# Patient Record
Sex: Female | Born: 1985
Health system: Southern US, Community
[De-identification: ages and names within clinical notes are randomized; demographics above are authoritative.]

## PROBLEM LIST (undated history)

## (undated) ENCOUNTER — Inpatient Hospital Stay (HOSPITAL_COMMUNITY): Payer: Self-pay

## (undated) DIAGNOSIS — N12 Tubulo-interstitial nephritis, not specified as acute or chronic: Secondary | ICD-10-CM

## (undated) DIAGNOSIS — D649 Anemia, unspecified: Secondary | ICD-10-CM

## (undated) DIAGNOSIS — O24419 Gestational diabetes mellitus in pregnancy, unspecified control: Secondary | ICD-10-CM

## (undated) DIAGNOSIS — N644 Mastodynia: Secondary | ICD-10-CM

## (undated) DIAGNOSIS — T8859XA Other complications of anesthesia, initial encounter: Secondary | ICD-10-CM

## (undated) HISTORY — DX: Anemia, unspecified: D64.9

## (undated) HISTORY — DX: Tubulo-interstitial nephritis, not specified as acute or chronic: N12

---

## 2007-01-06 ENCOUNTER — Inpatient Hospital Stay (HOSPITAL_COMMUNITY): Admission: AD | Admit: 2007-01-06 | Discharge: 2007-01-06 | Payer: Self-pay | Admitting: Obstetrics & Gynecology

## 2007-02-11 ENCOUNTER — Inpatient Hospital Stay (HOSPITAL_COMMUNITY): Admission: AD | Admit: 2007-02-11 | Discharge: 2007-02-11 | Payer: Self-pay | Admitting: Obstetrics and Gynecology

## 2007-06-26 ENCOUNTER — Encounter: Admission: RE | Admit: 2007-06-26 | Discharge: 2007-06-26 | Payer: Self-pay | Admitting: *Deleted

## 2007-08-28 ENCOUNTER — Inpatient Hospital Stay (HOSPITAL_COMMUNITY): Admission: AD | Admit: 2007-08-28 | Discharge: 2007-09-01 | Payer: Self-pay | Admitting: Obstetrics & Gynecology

## 2007-08-28 ENCOUNTER — Ambulatory Visit: Payer: Self-pay | Admitting: Gynecology

## 2008-05-04 ENCOUNTER — Emergency Department (HOSPITAL_COMMUNITY): Admission: EM | Admit: 2008-05-04 | Discharge: 2008-05-04 | Payer: Self-pay | Admitting: *Deleted

## 2010-09-12 ENCOUNTER — Inpatient Hospital Stay (HOSPITAL_COMMUNITY)
Admission: AD | Admit: 2010-09-12 | Discharge: 2010-09-12 | Payer: Self-pay | Source: Home / Self Care | Admitting: Obstetrics and Gynecology

## 2010-11-12 ENCOUNTER — Inpatient Hospital Stay (HOSPITAL_COMMUNITY)
Admission: AD | Admit: 2010-11-12 | Discharge: 2010-11-12 | Disposition: A | Discharge status: Home / Self Care | Payer: Self-pay | Source: Ambulatory Visit | Attending: Obstetrics & Gynecology | Admitting: Obstetrics & Gynecology

## 2010-11-12 DIAGNOSIS — O9989 Other specified diseases and conditions complicating pregnancy, childbirth and the puerperium: Secondary | ICD-10-CM

## 2010-11-12 DIAGNOSIS — O99891 Other specified diseases and conditions complicating pregnancy: Secondary | ICD-10-CM | POA: Insufficient documentation

## 2010-11-12 DIAGNOSIS — Y92410 Unspecified street and highway as the place of occurrence of the external cause: Secondary | ICD-10-CM | POA: Insufficient documentation

## 2010-11-12 LAB — URINALYSIS, ROUTINE W REFLEX MICROSCOPIC
Bilirubin Urine: NEGATIVE
Nitrite: NEGATIVE
Specific Gravity, Urine: <1.005 — ABNORMAL LOW (ref 1.005–1.030)
Urine Glucose, Fasting: NEGATIVE mg/dL
pH: 6 (ref 5.0–8.0)

## 2010-11-17 ENCOUNTER — Inpatient Hospital Stay (HOSPITAL_COMMUNITY)
Admission: EM | Admit: 2010-11-17 | Discharge: 2010-11-18 | Disposition: A | Discharge status: Home / Self Care | Payer: Self-pay | Source: Ambulatory Visit | Attending: Obstetrics & Gynecology | Admitting: Obstetrics & Gynecology

## 2010-11-17 DIAGNOSIS — R109 Unspecified abdominal pain: Secondary | ICD-10-CM

## 2010-11-17 DIAGNOSIS — O99891 Other specified diseases and conditions complicating pregnancy: Secondary | ICD-10-CM | POA: Insufficient documentation

## 2010-11-17 DIAGNOSIS — M549 Dorsalgia, unspecified: Secondary | ICD-10-CM

## 2010-11-17 DIAGNOSIS — O9989 Other specified diseases and conditions complicating pregnancy, childbirth and the puerperium: Secondary | ICD-10-CM

## 2010-11-17 LAB — URINALYSIS, ROUTINE W REFLEX MICROSCOPIC
Hgb urine dipstick: NEGATIVE
Nitrite: NEGATIVE
Specific Gravity, Urine: <1.005 — ABNORMAL LOW (ref 1.005–1.030)
Urobilinogen, UA: 0.2 mg/dL (ref 0.0–1.0)
pH: 7 (ref 5.0–8.0)

## 2010-11-20 ENCOUNTER — Other Ambulatory Visit: Payer: Self-pay | Admitting: Family Medicine

## 2010-11-20 DIAGNOSIS — Z3689 Encounter for other specified antenatal screening: Secondary | ICD-10-CM

## 2010-11-21 ENCOUNTER — Ambulatory Visit (HOSPITAL_COMMUNITY)
Admission: RE | Admit: 2010-11-21 | Discharge: 2010-11-21 | Disposition: A | Discharge status: Home / Self Care | Payer: Medicaid Other | Source: Ambulatory Visit | Attending: Family Medicine | Admitting: Family Medicine

## 2010-11-21 ENCOUNTER — Encounter (HOSPITAL_COMMUNITY): Payer: Self-pay

## 2010-11-21 DIAGNOSIS — O358XX0 Maternal care for other (suspected) fetal abnormality and damage, not applicable or unspecified: Secondary | ICD-10-CM | POA: Insufficient documentation

## 2010-11-21 DIAGNOSIS — Z3689 Encounter for other specified antenatal screening: Secondary | ICD-10-CM

## 2010-11-21 DIAGNOSIS — Z1389 Encounter for screening for other disorder: Secondary | ICD-10-CM | POA: Insufficient documentation

## 2010-11-21 DIAGNOSIS — Z363 Encounter for antenatal screening for malformations: Secondary | ICD-10-CM | POA: Insufficient documentation

## 2010-12-04 ENCOUNTER — Other Ambulatory Visit: Payer: Self-pay | Admitting: Obstetrics and Gynecology

## 2010-12-04 DIAGNOSIS — Z09 Encounter for follow-up examination after completed treatment for conditions other than malignant neoplasm: Secondary | ICD-10-CM

## 2010-12-05 ENCOUNTER — Other Ambulatory Visit: Payer: Self-pay | Admitting: Obstetrics and Gynecology

## 2010-12-05 ENCOUNTER — Ambulatory Visit (HOSPITAL_COMMUNITY)
Admission: RE | Admit: 2010-12-05 | Discharge: 2010-12-05 | Disposition: A | Discharge status: Home / Self Care | Payer: Medicaid Other | Source: Ambulatory Visit | Attending: Obstetrics and Gynecology | Admitting: Obstetrics and Gynecology

## 2010-12-05 DIAGNOSIS — Z09 Encounter for follow-up examination after completed treatment for conditions other than malignant neoplasm: Secondary | ICD-10-CM

## 2010-12-05 DIAGNOSIS — O209 Hemorrhage in early pregnancy, unspecified: Secondary | ICD-10-CM | POA: Insufficient documentation

## 2010-12-05 DIAGNOSIS — Z3689 Encounter for other specified antenatal screening: Secondary | ICD-10-CM | POA: Insufficient documentation

## 2010-12-06 ENCOUNTER — Inpatient Hospital Stay (HOSPITAL_COMMUNITY): Admission: AD | Admit: 2010-12-06 | Payer: Self-pay | Source: Home / Self Care | Admitting: Obstetrics & Gynecology

## 2010-12-18 LAB — WET PREP, GENITAL

## 2010-12-18 LAB — URINALYSIS, ROUTINE W REFLEX MICROSCOPIC
Glucose, UA: NEGATIVE mg/dL
Protein, ur: NEGATIVE mg/dL
Specific Gravity, Urine: 1.01 (ref 1.005–1.030)
Urobilinogen, UA: 0.2 mg/dL (ref 0.0–1.0)

## 2010-12-18 LAB — URINE MICROSCOPIC-ADD ON

## 2011-01-08 ENCOUNTER — Other Ambulatory Visit: Payer: Self-pay | Admitting: Family Medicine

## 2011-01-10 ENCOUNTER — Ambulatory Visit (HOSPITAL_COMMUNITY)
Admission: RE | Admit: 2011-01-10 | Discharge: 2011-01-10 | Disposition: A | Discharge status: Home / Self Care | Payer: Self-pay | Source: Ambulatory Visit | Attending: Family Medicine | Admitting: Family Medicine

## 2011-01-10 DIAGNOSIS — O337XX0 Maternal care for disproportion due to other fetal deformities, not applicable or unspecified: Secondary | ICD-10-CM | POA: Insufficient documentation

## 2011-01-10 DIAGNOSIS — O352XX0 Maternal care for (suspected) hereditary disease in fetus, not applicable or unspecified: Secondary | ICD-10-CM | POA: Insufficient documentation

## 2011-01-10 DIAGNOSIS — O3660X0 Maternal care for excessive fetal growth, unspecified trimester, not applicable or unspecified: Secondary | ICD-10-CM | POA: Insufficient documentation

## 2011-01-10 DIAGNOSIS — O09299 Supervision of pregnancy with other poor reproductive or obstetric history, unspecified trimester: Secondary | ICD-10-CM | POA: Insufficient documentation

## 2011-01-28 ENCOUNTER — Other Ambulatory Visit: Payer: Self-pay | Admitting: Obstetrics & Gynecology

## 2011-01-28 ENCOUNTER — Encounter: Payer: Medicaid Other | Attending: Obstetrics & Gynecology | Admitting: Dietician

## 2011-01-28 DIAGNOSIS — Z331 Pregnant state, incidental: Secondary | ICD-10-CM

## 2011-01-28 DIAGNOSIS — O99810 Abnormal glucose complicating pregnancy: Secondary | ICD-10-CM

## 2011-01-28 DIAGNOSIS — O99019 Anemia complicating pregnancy, unspecified trimester: Secondary | ICD-10-CM

## 2011-01-28 DIAGNOSIS — O34219 Maternal care for unspecified type scar from previous cesarean delivery: Secondary | ICD-10-CM

## 2011-01-28 DIAGNOSIS — Z713 Dietary counseling and surveillance: Secondary | ICD-10-CM | POA: Insufficient documentation

## 2011-01-28 LAB — POCT URINALYSIS DIP (DEVICE)
Bilirubin Urine: NEGATIVE
Glucose, UA: NEGATIVE mg/dL
Nitrite: NEGATIVE

## 2011-02-04 ENCOUNTER — Encounter: Payer: Medicaid Other | Attending: Obstetrics & Gynecology | Admitting: Dietician

## 2011-02-04 ENCOUNTER — Other Ambulatory Visit: Payer: Self-pay | Admitting: Obstetrics & Gynecology

## 2011-02-04 DIAGNOSIS — Z331 Pregnant state, incidental: Secondary | ICD-10-CM

## 2011-02-04 DIAGNOSIS — O169 Unspecified maternal hypertension, unspecified trimester: Secondary | ICD-10-CM

## 2011-02-04 DIAGNOSIS — O99810 Abnormal glucose complicating pregnancy: Secondary | ICD-10-CM | POA: Insufficient documentation

## 2011-02-04 DIAGNOSIS — Z713 Dietary counseling and surveillance: Secondary | ICD-10-CM | POA: Insufficient documentation

## 2011-02-04 LAB — POCT URINALYSIS DIP (DEVICE)
Bilirubin Urine: NEGATIVE
Glucose, UA: NEGATIVE mg/dL
Ketones, ur: NEGATIVE mg/dL

## 2011-02-11 ENCOUNTER — Other Ambulatory Visit: Payer: Self-pay | Admitting: Obstetrics & Gynecology

## 2011-02-11 DIAGNOSIS — Z331 Pregnant state, incidental: Secondary | ICD-10-CM

## 2011-02-11 DIAGNOSIS — O24919 Unspecified diabetes mellitus in pregnancy, unspecified trimester: Secondary | ICD-10-CM

## 2011-02-11 LAB — POCT URINALYSIS DIP (DEVICE)
Glucose, UA: NEGATIVE mg/dL
Ketones, ur: NEGATIVE mg/dL
Specific Gravity, Urine: 1.02 (ref 1.005–1.030)
Urobilinogen, UA: 0.2 mg/dL (ref 0.0–1.0)

## 2011-02-18 ENCOUNTER — Other Ambulatory Visit: Payer: Self-pay | Admitting: Obstetrics & Gynecology

## 2011-02-18 DIAGNOSIS — O99019 Anemia complicating pregnancy, unspecified trimester: Secondary | ICD-10-CM

## 2011-02-18 DIAGNOSIS — O34219 Maternal care for unspecified type scar from previous cesarean delivery: Secondary | ICD-10-CM

## 2011-02-18 DIAGNOSIS — Z331 Pregnant state, incidental: Secondary | ICD-10-CM

## 2011-02-18 DIAGNOSIS — O99810 Abnormal glucose complicating pregnancy: Secondary | ICD-10-CM

## 2011-02-18 LAB — POCT URINALYSIS DIP (DEVICE)
Bilirubin Urine: NEGATIVE
Glucose, UA: NEGATIVE mg/dL
Hgb urine dipstick: NEGATIVE
Nitrite: NEGATIVE
Specific Gravity, Urine: 1.025 (ref 1.005–1.030)

## 2011-02-19 NOTE — Discharge Summary (Signed)
Victoria Richmond, Victoria Richmond NO.:  [id number]   MEDICAL RECORD NO.:  [id number]          PATIENT TYPE:  INP   LOCATION:  9110                          FACILITY:  [facility]   PHYSICIAN:  Ginger Carne, MD  DATE OF BIRTH:  August 05, 1986   DATE OF ADMISSION:  08/28/2007  DATE OF DISCHARGE:  09/01/2007                               DISCHARGE SUMMARY   ADMISSION DIAGNOSIS:  Induction of labor, class A2 gestational diabetes.   DISCHARGE DIAGNOSIS:  1. Postoperative day 3 status post primary low transverse cesarean      section for failure to progress in the first stage of labor.  2. Class A2 gestational diabetes status post delivery.   RESULTS:  On admission patient had a CBC performed with a hemoglobin of  11.9.  On postoperative day #1 CBC was repeated and hemoglobin was found  to be 9.5.  White blood cell count was 15.2 and platelet count was 168.   HOSPITAL COURSE:  Patient was admitted for induction of labor secondary  to class A2 gestational diabetes.  The patient was induced, however,  failed to progress in the first stage of labor.  Therefore, the decision  was made to take the patient for a primary low transverse cesarean  section secondary to failure to progress.  This procedure was performed  on August 29, 2007 by Dr. Blima Rich.  This procedure rendered a  viable female infant.  Estimated blood loss was found to be 800 mL.  Infant was alert in vertex presentation.  Weight was noted to be 10  pounds 1 ounce at delivery.  Apgar's are per delivery room record.  Patient had an uncomplicated procedure and was sent to the recovery room  in stable condition.  It should be noted that after the procedure the  patient reported that she could feel her cesarean section during the  time of the procedure.  Thereafter, the patient had difficulty  controlling her pain in her postoperative course.  Her vital signs  remained stable throughout her postoperative course, but she did  have  difficulty with pain control.  Patient was started on Dilaudid PCA.  This was supplemented with Toradol.  She was then transitioned to by  mouth Percocet and Motrin as well.  These medicines did improve her  pain, however, it was not fully controlled.  Throughout her  postoperative course the patient's vital signs did remain stable.  She  remained afebrile as well.  She did have a little bit of tachycardia up  to the low 100s, but blood pressures remained stable.  Postoperative CBG  on the date of discharge was noted to be 142.  On postoperative day #3  vital signs continued to be stable.  Patient had continued to report  pain with some improvement with p.o. medications.  It was discussed with  the patient that given that her vital signs were stable and her blood  counts were stable as well she was fit for discharge.  She was informed  that she would be discharged with Percocet and Motrin to go home with  and patient expressed understanding and agreement with this course of  action.  Patient was advised to return for any increase in her pain,  fever, chills, increased bleeding, dizziness, or any other concerning  symptoms.  Patient expressed agreement and understanding of these  instructions.  On postoperative day #3 patient was discharged home with  her female infant.  She was breast and bottle feeding at the time of  discharge and was in stable and good condition at the time of discharge.   DISCHARGE MEDICATIONS:  1. Percocet 5/325 mg 1-2 tabs q.4 h. p.r.n. pain.  2. Ibuprofen 600 mg p.o. q.6 h. p.r.n. pain.  3. Prenatal vitamins once daily for 6 weeks or while breastfeeding.  4. Micronor once daily for birth control.  5. Colace 100 mg p.o. twice daily as needed for constipation.   DISCHARGE INSTRUCTIONS:  1. The patient is to take medications as mentioned previously.  2. Patient is to return to clinic or seek medical attention for any      increased pain, bleeding, fever,  chills, dizziness, lower extremity      swelling, edema, pain, redness, or any other concerning symptoms.      Patient expresses agreement and understanding with these      instructions.  3. Patient will have a Baby Love Nurse to come to her house to remove      staples on postoperative day 5 through 7.  4. Patient is to followup at the [facility] in      6 weeks routinely for postpartum followup.  5. Patient is to take Micronor once daily for birth control.  6. Patient is to maintain pelvic rest for the next 6 weeks with      nothing in the vagina over this time.  7. Patient is to not undertake any heavy lifting for the next 6 weeks.  8. Patient will have a 2-hour glucose tolerance test at her 6-week      followup visit given her history of gestational diabetes.   DISCHARGE CONDITION:  Stable, good.   DISPOSITION:  Patient is discharged to home.  Baby Love Nurse will come  out to the house to take staples out postoperative day 5-7.  Patient is  breastfeeding and bottle feeding at the time of discharge, and is  discharged in stable and good condition.      Myrtie Soman, MD      Ginger Carne, MD  Electronically Signed    TE/MEDQ  D:  09/01/2007  T:  09/01/2007  Job:  [id number]

## 2011-02-19 NOTE — Op Note (Signed)
Victoria Richmond, Victoria Richmond NO.:  [id number]   MEDICAL RECORD NO.:  [id number]          PATIENT TYPE:  INP   LOCATION:  9175                          [facility]ILITY:  [facility]   PHYSICIAN:  Ginger Carne, MD  DATE OF BIRTH:  [birth year]   DATE OF PROCEDURE:  08/29/2007  DATE OF DISCHARGE:                               OPERATIVE REPORT   PREOPERATIVE DIAGNOSIS:  Failure to progress first stage of labor, A2  gestational diabetes.   POSTOPERATIVE DIAGNOSIS:  1. Failure to progress first stage of labor, A2 gestational diabetes.  2. Viable delivery of female infant.   PROCEDURE:  Primary low transverse cesarean section.   SURGEON:  Ginger Carne, M.D.   ASSISTANT:  None.   COMPLICATIONS:  None immediate.   ESTIMATED BLOOD LOSS:  800 mL secondary to uterine atony.   ANESTHESIA:  Epidural top off.   SPECIMEN:  Cord blood   OPERATIVE FINDINGS:  A term infant female delivered in vertex presentation  weighing 10 pounds 1 ounce, Apgars per delivery room record.  No gross  abnormalities.  Baby cried spontaneously at delivery, vertex  presentation.  Clear fluid.  Uterus, tubes and ovaries showed normal  decidual changes of pregnancy.  Placenta, three-vessel cord central  insertion and placenta complete.  The uterus demonstrated atony  secondary to prolonged first stage of labor and large fetus.  Two doses,  20 minutes apart, of intramuscular Methergine 0.2 mg, 40 units of  Pitocin per liter and 1000 mcg of Cytotec per rectum were needed and  administered.   OPERATIVE PROCEDURE:  The patient was prepped and draped in usual  fashion and placed in the left lateral supine position.  Betadine  solution used for antiseptic and the patient was catheterized prior to  procedure.  After adequate epidural top-off, a Pfannenstiel incision was  made and the abdomen opened.  The lower uterine segment was incised  transversely after developing the bladder flap.  Baby delivered, cord  clamped, cut and infant given to the pediatric staff after bulb  suctioning.  Placenta removed manually.  Uterus inspected.  Closure of  the uterine musculature in one layer of 0 Vicryl running interlocking  suture from either end to midline.  Bleeding points  hemostatically checked.  Blood clots removed.  Closure of the fascia in  one layer of 0 Vicryl running suture.  Skin staples for the skin.  Instrument and sponge counts were correct.  The patient tolerated the  procedure well, returned to the post anesthesia recovery room in  excellent condition.      Ginger Carne, MD  Electronically Signed     SHB/MEDQ  D:  08/29/2007  T:  08/31/2007  Job:  [id number]

## 2011-02-21 ENCOUNTER — Other Ambulatory Visit: Payer: Self-pay

## 2011-02-21 DIAGNOSIS — O99810 Abnormal glucose complicating pregnancy: Secondary | ICD-10-CM

## 2011-02-25 ENCOUNTER — Other Ambulatory Visit: Payer: Self-pay | Admitting: Family Medicine

## 2011-02-25 ENCOUNTER — Other Ambulatory Visit: Payer: Self-pay

## 2011-02-25 DIAGNOSIS — O34219 Maternal care for unspecified type scar from previous cesarean delivery: Secondary | ICD-10-CM

## 2011-02-25 DIAGNOSIS — Z331 Pregnant state, incidental: Secondary | ICD-10-CM

## 2011-02-25 DIAGNOSIS — O99810 Abnormal glucose complicating pregnancy: Secondary | ICD-10-CM

## 2011-02-25 LAB — POCT URINALYSIS DIP (DEVICE)
Bilirubin Urine: NEGATIVE
Ketones, ur: NEGATIVE mg/dL
pH: 6 (ref 5.0–8.0)

## 2011-02-28 ENCOUNTER — Other Ambulatory Visit: Payer: Self-pay

## 2011-02-28 DIAGNOSIS — O99810 Abnormal glucose complicating pregnancy: Secondary | ICD-10-CM

## 2011-02-28 DIAGNOSIS — Z331 Pregnant state, incidental: Secondary | ICD-10-CM

## 2011-03-05 ENCOUNTER — Other Ambulatory Visit: Payer: Self-pay

## 2011-03-05 DIAGNOSIS — O99810 Abnormal glucose complicating pregnancy: Secondary | ICD-10-CM

## 2011-03-07 ENCOUNTER — Other Ambulatory Visit: Payer: Self-pay

## 2011-03-07 ENCOUNTER — Other Ambulatory Visit: Payer: Self-pay | Admitting: Obstetrics and Gynecology

## 2011-03-07 ENCOUNTER — Other Ambulatory Visit: Payer: Self-pay | Admitting: Obstetrics & Gynecology

## 2011-03-07 DIAGNOSIS — Z331 Pregnant state, incidental: Secondary | ICD-10-CM

## 2011-03-07 DIAGNOSIS — O99810 Abnormal glucose complicating pregnancy: Secondary | ICD-10-CM

## 2011-03-07 DIAGNOSIS — Z1389 Encounter for screening for other disorder: Secondary | ICD-10-CM

## 2011-03-07 LAB — POCT URINALYSIS DIP (DEVICE)
Bilirubin Urine: NEGATIVE
Glucose, UA: NEGATIVE mg/dL
Ketones, ur: NEGATIVE mg/dL
Protein, ur: NEGATIVE mg/dL
Specific Gravity, Urine: <=1.005 (ref 1.005–1.030)

## 2011-03-11 ENCOUNTER — Other Ambulatory Visit: Payer: Self-pay | Admitting: Obstetrics & Gynecology

## 2011-03-11 ENCOUNTER — Other Ambulatory Visit: Payer: Self-pay

## 2011-03-11 DIAGNOSIS — O99810 Abnormal glucose complicating pregnancy: Secondary | ICD-10-CM

## 2011-03-11 DIAGNOSIS — Z331 Pregnant state, incidental: Secondary | ICD-10-CM

## 2011-03-11 LAB — POCT URINALYSIS DIP (DEVICE)
Bilirubin Urine: NEGATIVE
Glucose, UA: NEGATIVE mg/dL
Hgb urine dipstick: NEGATIVE
Ketones, ur: NEGATIVE mg/dL
Specific Gravity, Urine: 1.025 (ref 1.005–1.030)

## 2011-03-14 ENCOUNTER — Ambulatory Visit (HOSPITAL_COMMUNITY): Payer: Self-pay

## 2011-03-15 ENCOUNTER — Other Ambulatory Visit: Payer: Self-pay

## 2011-03-15 ENCOUNTER — Ambulatory Visit (HOSPITAL_COMMUNITY): Payer: Self-pay

## 2011-03-15 DIAGNOSIS — O99810 Abnormal glucose complicating pregnancy: Secondary | ICD-10-CM

## 2011-03-18 ENCOUNTER — Other Ambulatory Visit: Payer: Self-pay

## 2011-03-18 ENCOUNTER — Other Ambulatory Visit: Payer: Self-pay | Admitting: Obstetrics & Gynecology

## 2011-03-18 ENCOUNTER — Encounter: Payer: Self-pay | Attending: Obstetrics & Gynecology | Admitting: Dietician

## 2011-03-18 DIAGNOSIS — O99810 Abnormal glucose complicating pregnancy: Secondary | ICD-10-CM

## 2011-03-18 DIAGNOSIS — Z713 Dietary counseling and surveillance: Secondary | ICD-10-CM | POA: Insufficient documentation

## 2011-03-18 DIAGNOSIS — O34219 Maternal care for unspecified type scar from previous cesarean delivery: Secondary | ICD-10-CM

## 2011-03-18 LAB — POCT URINALYSIS DIP (DEVICE)
Bilirubin Urine: NEGATIVE
Hgb urine dipstick: NEGATIVE
Ketones, ur: NEGATIVE mg/dL
Specific Gravity, Urine: 1.025 (ref 1.005–1.030)
pH: 5.5 (ref 5.0–8.0)

## 2011-03-21 ENCOUNTER — Other Ambulatory Visit: Payer: Self-pay

## 2011-03-21 DIAGNOSIS — O99810 Abnormal glucose complicating pregnancy: Secondary | ICD-10-CM

## 2011-03-25 ENCOUNTER — Other Ambulatory Visit: Payer: Self-pay

## 2011-03-25 ENCOUNTER — Other Ambulatory Visit: Payer: Self-pay | Admitting: Obstetrics & Gynecology

## 2011-03-25 DIAGNOSIS — O99810 Abnormal glucose complicating pregnancy: Secondary | ICD-10-CM

## 2011-03-25 DIAGNOSIS — O99019 Anemia complicating pregnancy, unspecified trimester: Secondary | ICD-10-CM

## 2011-03-25 DIAGNOSIS — O34219 Maternal care for unspecified type scar from previous cesarean delivery: Secondary | ICD-10-CM

## 2011-03-25 LAB — POCT URINALYSIS DIP (DEVICE)
Bilirubin Urine: NEGATIVE
Ketones, ur: NEGATIVE mg/dL
Protein, ur: NEGATIVE mg/dL
Specific Gravity, Urine: 1.01 (ref 1.005–1.030)
pH: 5.5 (ref 5.0–8.0)

## 2011-03-28 ENCOUNTER — Other Ambulatory Visit: Payer: Self-pay

## 2011-03-28 DIAGNOSIS — O99810 Abnormal glucose complicating pregnancy: Secondary | ICD-10-CM

## 2011-04-01 ENCOUNTER — Other Ambulatory Visit: Payer: Self-pay

## 2011-04-01 ENCOUNTER — Other Ambulatory Visit: Payer: Self-pay | Admitting: Family Medicine

## 2011-04-01 DIAGNOSIS — O99810 Abnormal glucose complicating pregnancy: Secondary | ICD-10-CM

## 2011-04-01 LAB — POCT URINALYSIS DIP (DEVICE)
Hgb urine dipstick: NEGATIVE
Ketones, ur: NEGATIVE mg/dL
Protein, ur: NEGATIVE mg/dL
Specific Gravity, Urine: 1.025 (ref 1.005–1.030)
Urobilinogen, UA: 0.2 mg/dL (ref 0.0–1.0)

## 2011-04-02 ENCOUNTER — Ambulatory Visit (HOSPITAL_COMMUNITY)
Admission: RE | Admit: 2011-04-02 | Discharge: 2011-04-02 | Disposition: A | Discharge status: Home / Self Care | Payer: Self-pay | Source: Ambulatory Visit | Attending: Obstetrics & Gynecology | Admitting: Obstetrics & Gynecology

## 2011-04-02 ENCOUNTER — Encounter (HOSPITAL_COMMUNITY)
Admission: RE | Admit: 2011-04-02 | Discharge: 2011-04-02 | Disposition: A | Discharge status: Home / Self Care | Payer: Self-pay | Source: Ambulatory Visit | Attending: Obstetrics & Gynecology | Admitting: Obstetrics & Gynecology

## 2011-04-02 DIAGNOSIS — O09299 Supervision of pregnancy with other poor reproductive or obstetric history, unspecified trimester: Secondary | ICD-10-CM | POA: Insufficient documentation

## 2011-04-02 DIAGNOSIS — Z1389 Encounter for screening for other disorder: Secondary | ICD-10-CM

## 2011-04-02 DIAGNOSIS — O3660X0 Maternal care for excessive fetal growth, unspecified trimester, not applicable or unspecified: Secondary | ICD-10-CM | POA: Insufficient documentation

## 2011-04-02 DIAGNOSIS — O99810 Abnormal glucose complicating pregnancy: Secondary | ICD-10-CM | POA: Insufficient documentation

## 2011-04-02 LAB — BASIC METABOLIC PANEL
CO2: 22 mEq/L (ref 19–32)
Calcium: 9.1 mg/dL (ref 8.4–10.5)
Chloride: 104 mEq/L (ref 96–112)
Creatinine, Ser: 0.55 mg/dL (ref 0.50–1.10)
Glucose, Bld: 57 mg/dL — ABNORMAL LOW (ref 70–99)
Sodium: 136 mEq/L (ref 135–145)

## 2011-04-02 LAB — SURGICAL PCR SCREEN
MRSA, PCR: NEGATIVE
Staphylococcus aureus: NEGATIVE

## 2011-04-02 LAB — CBC
HCT: 33.4 % — ABNORMAL LOW (ref 36.0–46.0)
MCH: 28.9 pg (ref 26.0–34.0)
MCV: 87.7 fL (ref 78.0–100.0)
Platelets: 197 10*3/uL (ref 150–400)
RBC: 3.81 MIL/uL — ABNORMAL LOW (ref 3.87–5.11)
RDW: 14.6 % (ref 11.5–15.5)

## 2011-04-04 ENCOUNTER — Other Ambulatory Visit: Payer: Self-pay

## 2011-04-04 DIAGNOSIS — O99810 Abnormal glucose complicating pregnancy: Secondary | ICD-10-CM

## 2011-04-06 ENCOUNTER — Inpatient Hospital Stay (HOSPITAL_COMMUNITY)
Admission: RE | Admit: 2011-04-06 | Discharge: 2011-04-06 | Disposition: A | Discharge status: Home / Self Care | Payer: Self-pay | Source: Ambulatory Visit | Attending: Obstetrics and Gynecology | Admitting: Obstetrics and Gynecology

## 2011-04-06 DIAGNOSIS — O479 False labor, unspecified: Secondary | ICD-10-CM | POA: Insufficient documentation

## 2011-04-06 LAB — GLUCOSE, CAPILLARY: Glucose-Capillary: 68 mg/dL — ABNORMAL LOW (ref 70–99)

## 2011-04-08 ENCOUNTER — Inpatient Hospital Stay (HOSPITAL_COMMUNITY)
Admission: RE | Admit: 2011-04-08 | Discharge: 2011-04-10 | DRG: 766 | Disposition: A | Discharge status: Home / Self Care | Payer: Medicaid Other | Source: Ambulatory Visit | Attending: Obstetrics & Gynecology | Admitting: Obstetrics & Gynecology

## 2011-04-08 DIAGNOSIS — O9989 Other specified diseases and conditions complicating pregnancy, childbirth and the puerperium: Secondary | ICD-10-CM

## 2011-04-08 DIAGNOSIS — O99814 Abnormal glucose complicating childbirth: Secondary | ICD-10-CM

## 2011-04-08 DIAGNOSIS — M25519 Pain in unspecified shoulder: Secondary | ICD-10-CM | POA: Diagnosis not present

## 2011-04-08 DIAGNOSIS — O99893 Other specified diseases and conditions complicating puerperium: Secondary | ICD-10-CM | POA: Diagnosis not present

## 2011-04-08 DIAGNOSIS — O34219 Maternal care for unspecified type scar from previous cesarean delivery: Secondary | ICD-10-CM

## 2011-04-09 LAB — CBC
MCH: 28.3 pg (ref 26.0–34.0)
MCHC: 32.3 g/dL (ref 30.0–36.0)
MCV: 87.6 fL (ref 78.0–100.0)
Platelets: 172 10*3/uL (ref 150–400)
RBC: 3.64 MIL/uL — ABNORMAL LOW (ref 3.87–5.11)
RDW: 15 % (ref 11.5–15.5)

## 2011-04-09 LAB — GLUCOSE, CAPILLARY: Glucose-Capillary: 121 mg/dL — ABNORMAL HIGH (ref 70–99)

## 2011-04-12 LAB — TYPE AND SCREEN
ABO/RH(D): O POS
DAT, IgG: NEGATIVE
Unit division: 0

## 2011-04-16 NOTE — Op Note (Addendum)
Victoria Richmond, Victoria Richmond NO.:  [id number]  MEDICAL RECORD NO.:  [id number]  LOCATION:  9143                          [facility]:  [facility]  PHYSICIAN:  Scheryl Darter, MD       DATE OF BIRTH:  Mar 02, 1986  DATE OF PROCEDURE:  04/08/2011 DATE OF DISCHARGE:                              OPERATIVE REPORT   PREOPERATIVE DIAGNOSIS:  The patient is a 25 year old gravida 2, para 1- 0-0-1 at 29 and 2 weeks' estimated gestational age with a history of cesarean section who desires a repeat.  POSTOPERATIVE DIAGNOSIS:  The patient is a 25 year old gravida 2, para 1- 0-0-1 at 8 and 2 weeks' estimated gestational age with a history of cesarean section who desires a repeat.  PROCEDURE:  Repeat low transverse cesarean section.  SURGEONS: 1. Scheryl Darter, MD 2. Lucina Mellow, DO  ANESTHESIA:  Spinal.  FINDINGS:  Viable infant female in vertex position.  Weight 8 pounds 9 ounces.  Apgars 9 at 1 minute and 9 at 5 minutes.  Normal tubes, uterus, and ovaries.  Many adhesions were noted.  SPECIMEN:  Placenta to labor and delivery.  ESTIMATED BLOOD LOSS:  700.  FLUIDS:  2500.  URINE:  800.  PROCEDURE IN DETAIL:  The patient was taken to the operating room where the spinal anesthesia was found to be adequate.  She was prepared and draped in the normal sterile fashion in the dorsal supine position with a leftward tilt.  A Pfannensteil skin incision was then made with a scalpel and carried through to the underlying fascia with a knife.  The fascia was incised in the midline and the incision was extended laterally with the Mayo scissors.  The superior aspect of the fascial incision was grasped with the Kocher clamps, elevated, and the underlying rectus muscles were dissected off bluntly.  Attention was then turned to the inferior aspect of this incision which in similar fashion was grasped, tented up with Kocher clamps and the rectus muscle was dissected off bluntly.  Many  adhesions were encountered at this time.  The peritoneum was identified and entered and adhesions were broken down.  It was difficult to determine if the bladder was part adhesions but they were separated and broken down.  The urine remained clear.  The bladder blade was inserted and the serosal layer and the vesicouterine layer of the uterus were identified and adhesions were broken down to identify the appropriate area on the uterus for incision. The uterus was incised in the upper portion of the lower uterine segment in a transverse fashion with the scalpel.  The uterine incision was then extended laterally and upwardly with traction.  The amniotic sac bulged through the opening and was broken with Allis clamp.  The bladder blade was removed and the infant's head was delivered with use of vacuum application and extension of the incision.  The nose and mouth were suctioned on the table with the bulb suction.  The cord was clamped and cut.  The infant was given to the awaiting pediatricians with a good cry.  A cord blood sample was obtained.  The placenta was then removed manually.  The uterus was cleared of all clots and debris.  The uterine incision was  repaired with 0-Vicryl in a running locked fashion.  A second layer of the same was used to obtain excellent hemostasis.  Two figure-of-eights were necessary to obtain hemostasis in one particular area on the upper portion of the incision.  To get the adhesions, the bladder flap and the peritoneum were not repaired or closed.  The fascia was reapproximated with 0-Vicryl in a running fashion.  The skin was closed with suture.  The patient tolerated the procedure well.  Sponge, lap, and needle counts were correct x3.  Ancef 2 grams were given prior to the procedure.  The patient was taken to the recovery room in stable condition.    ______________________________ Lucina Mellow, DO   ______________________________ Scheryl Darter,  MD    SH/MEDQ  D:  04/08/2011  T:  04/09/2011  Job:  [id number]  Electronically Signed by Scheryl Darter MD on 04/16/2011 12:32:06 PM Electronically Signed by Lucina Mellow MD on 04/18/2011 10:04:49 AM

## 2011-05-24 ENCOUNTER — Encounter: Payer: Self-pay | Admitting: Family Medicine

## 2011-05-24 ENCOUNTER — Ambulatory Visit (INDEPENDENT_AMBULATORY_CARE_PROVIDER_SITE_OTHER): Payer: Medicaid Other | Admitting: Family Medicine

## 2011-05-24 NOTE — Progress Notes (Signed)
  Subjective:    Patient ID: Victoria Richmond, female    DOB: 07-04-1986, 25 y.o.   MRN: [id number]  HPI Patient seen for postpartum visit.  She delivered on 04/08/11 via repeat c-section and had GDM.  She is currently 6 weeks PP>  She denies PP depression, vaginal bleeding, vaginal discharge, urinary incontinence.  She is taking oral contraceptives, but does not remember the medication.  She has occasional mild right sided incisional tenderness.    Review of Systems  Constitutional: Negative for fever, chills and fatigue.  Gastrointestinal: Negative for nausea, vomiting, diarrhea, constipation and abdominal distention.  Genitourinary: Negative for dysuria, hematuria, flank pain, vaginal bleeding, vaginal discharge and pelvic pain.       Objective:   Physical Exam  Constitutional: She appears well-developed and well-nourished.  Abdominal: Soft.       Mild right sided incisional tenderness.  Incision clean, dry, intact and well healed.  Genitourinary:       Fundus at [redacted]weeks gestational size.  No adenexal mass.  No cmt.  Skin: Skin is warm and dry.          Assessment & Plan:  1. PP check.   - Incision well healed.  Pt to follow up with Health department for continued regular gyn care.

## 2011-05-24 NOTE — Progress Notes (Deleted)
Subjective:     Patient ID: Victoria Richmond, female   DOB: 1985-12-17, 25 y.o.   MRN: [id number]  HPI   Review of Systems     Objective:   Physical Exam     Assessment:     ***    Plan:     ***

## 2011-07-05 LAB — URINE MICROSCOPIC-ADD ON

## 2011-07-05 LAB — URINALYSIS, ROUTINE W REFLEX MICROSCOPIC
Glucose, UA: NEGATIVE
Protein, ur: NEGATIVE
Specific Gravity, Urine: 1.013
pH: 5.5

## 2011-07-05 LAB — CBC
Hemoglobin: 13.9
MCV: 88.6
RBC: 4.68
WBC: 9.7

## 2011-07-05 LAB — DIFFERENTIAL
Eosinophils Absolute: 0
Lymphs Abs: 2.6
Monocytes Absolute: 0.3
Monocytes Relative: 3
Neutrophils Relative %: 69

## 2011-07-05 LAB — POCT I-STAT, CHEM 8
BUN: 9
Creatinine, Ser: 0.8
Glucose, Bld: 120 — ABNORMAL HIGH
Potassium: 3.2 — ABNORMAL LOW
Sodium: 139

## 2011-07-05 LAB — WET PREP, GENITAL: Trich, Wet Prep: NONE SEEN

## 2011-07-05 LAB — GC/CHLAMYDIA PROBE AMP, GENITAL: Chlamydia, DNA Probe: NEGATIVE

## 2011-07-09 NOTE — Discharge Summary (Signed)
  Victoria Richmond, LIO NO.:  [id number]  MEDICAL RECORD NO.:  [id number]  LOCATION:  9143                          [facility]ACILITY:  [facility]  PHYSICIAN:  Lazaro Arms, M.D.   DATE OF BIRTH:  1986-07-05  DATE OF ADMISSION:  04/08/2011 DATE OF DISCHARGE:  04/10/2011                              DISCHARGE SUMMARY   ADMITTING DIAGNOSES: 1. Intrauterine pregnancy at 4 and 2/7 weeks. 2. Previous cesarean section with desire for repeat. 3. Gestational diabetes.  DISCHARGE DIAGNOSES: 1. Intrauterine pregnancy at 72 and 2/7 weeks. 2. Previous cesarean section with desire for repeat. 3. Gestational diabetes.  HOSPITAL COURSE:  [name] is a 25 year old gravida 2, para 1- 0-0-1 who presented at 66 and 2/7 weeks' gestation for scheduled cesarean section.  Her pregnancy has been followed [facility] by the [facility] and then [facility] upon the diagnosis of gestational diabetes and has been remarkable for; 1. Previous cesarean section. 2. Anemia. 3. History of LGA infant. 4. For her gestational diabetes, the patient was taking glyburide 2.5     mg q.p.m.  The patient was taken to the operating room, cesarean section was performed by Dr. Debroah Loop and Dr. Natale Milch.  Infant was a viable female, Apgars of 9 and 9, weight of 8 pounds 9 ounces.  Infant was taken to the Full-Term Nursery in good condition.  The patient was taken to the recovery room and was doing well.  By postop day #1, her vital signs were stable.  Her CBC on day one, hemoglobin 10.3, hematocrit 31.9, white blood cell count 7.5, and platelets 172,000.  Her CBC predelivery hemoglobin 11.0, hematocrit 33.4, white blood cell count 7.0, platelets 197,000.  The patient was bottle feeding.  She expressed a desire for oral contraceptives.  She was having some right shoulder pain that continued into postop day #2.  Vital signs still remained stable.  Her CBGs ranged from 116-234 without her  glyburide.  ANESTHESIA:  Will evaluate the patient's shoulder pain, but she is desiring a discharge home today.  DISCHARGE INSTRUCTIONS:  Will be per postpartum handout.  DISCHARGE MEDICATIONS: 1. Motrin 600 mg 1 p.o. q.6 h. p.r.n. pain. 2. Percocet 1-2 q.4 h. p.r.n. pain. 3. Sprintec 1 p.o. daily, to start on May 05, 2011.  DISCHARGE FOLLOWUP:  Will occur at the [facility] in 4-6 weeks or sooner if needed.     Cam Hai, C.N.M.   ______________________________ Lazaro Arms, M.D.    KS/MEDQ  D:  04/10/2011  T:  04/10/2011  Job:  [id number]  Electronically Signed by Cam Hai C.N.M. on 04/17/2011 07:14:22 PM Electronically Signed by Duane Lope M.D. on 07/09/2011 03:08:33 PM

## 2011-07-16 LAB — CBC
HCT: 27.4 — ABNORMAL LOW
HCT: 35.9 — ABNORMAL LOW
Hemoglobin: 11.9 — ABNORMAL LOW
Hemoglobin: 9.5 — ABNORMAL LOW
MCHC: 34.6
MCV: 86.3
RBC: 3.17 — ABNORMAL LOW
WBC: 6.5

## 2012-11-30 ENCOUNTER — Encounter (HOSPITAL_COMMUNITY): Payer: Self-pay | Admitting: *Deleted

## 2012-12-08 ENCOUNTER — Encounter (HOSPITAL_COMMUNITY): Payer: Self-pay

## 2012-12-08 ENCOUNTER — Ambulatory Visit (HOSPITAL_COMMUNITY)
Admission: RE | Admit: 2012-12-08 | Discharge: 2012-12-08 | Disposition: A | Discharge status: Home / Self Care | Payer: Medicaid Other | Source: Ambulatory Visit | Attending: Obstetrics and Gynecology | Admitting: Obstetrics and Gynecology

## 2012-12-08 ENCOUNTER — Other Ambulatory Visit: Payer: Self-pay | Admitting: Obstetrics and Gynecology

## 2012-12-08 VITALS — BP 98/72 | Temp 98.8°F | Ht 59.0 in | Wt 124.0 lb

## 2012-12-08 DIAGNOSIS — N644 Mastodynia: Secondary | ICD-10-CM

## 2012-12-08 DIAGNOSIS — Z1239 Encounter for other screening for malignant neoplasm of breast: Secondary | ICD-10-CM

## 2012-12-08 HISTORY — DX: Mastodynia: N64.4

## 2012-12-08 NOTE — Patient Instructions (Signed)
Taught patient how to perform BSE and gave educational materials to take home. Patient did not need a Pap smear today due to last Pap smear was 06/30/2012. Told patient about free cervical cancer screenings to receive a Pap smear if would like one next year. Let her know [facility] will cover Pap smears every 3 years unless has a history of abnormal Pap smears. Referred patient to the [facility] for left breast ultrasound. Appointment scheduled for Monday, December 14, 2012 at 0850. Patient aware of appointment and will be there. Patient verbalized understanding.

## 2012-12-08 NOTE — Progress Notes (Signed)
Complaints of left breast pain x 2 weeks that comes and goes. Patient rates pain at a 5 out of 10 stating it increases with activity.  Pap Smear:    Pap smear not completed today. Last Pap smear was 06/30/2012 at the [facility] and normal. Per patient has no history of abnormal Pap smears. No Pap smear results in EPIC.  Physical exam: Breasts Breasts symmetrical. No skin abnormalities bilateral breasts. No nipple retraction bilateral breasts. No nipple discharge bilateral breasts. No lymphadenopathy. No lumps palpated bilateral breasts. Complaints of left outer quadrant breast pain on exam. Referred patient to the [facility] for left breast ultrasound. Appointment scheduled for Monday, December 14, 2012 at 0850. Patient escorted to mammography for a screening mammogram.        Pelvic/Bimanual No Pap smear completed today since last Pap smear was 06/30/2012. Pap smear not indicated per [facility]CCCP guidelines.

## 2012-12-14 ENCOUNTER — Ambulatory Visit
Admission: RE | Admit: 2012-12-14 | Discharge: 2012-12-14 | Disposition: A | Discharge status: Home / Self Care | Payer: Medicaid Other | Source: Ambulatory Visit | Attending: Obstetrics and Gynecology | Admitting: Obstetrics and Gynecology

## 2012-12-14 DIAGNOSIS — N644 Mastodynia: Secondary | ICD-10-CM

## 2013-09-05 ENCOUNTER — Inpatient Hospital Stay (HOSPITAL_COMMUNITY)
Admission: AD | Admit: 2013-09-05 | Discharge: 2013-09-06 | Disposition: A | Discharge status: Home / Self Care | Payer: Medicaid Other | Source: Ambulatory Visit | Attending: Obstetrics and Gynecology | Admitting: Obstetrics and Gynecology

## 2013-09-05 ENCOUNTER — Encounter (HOSPITAL_COMMUNITY): Payer: Self-pay | Admitting: *Deleted

## 2013-09-05 DIAGNOSIS — R109 Unspecified abdominal pain: Secondary | ICD-10-CM | POA: Insufficient documentation

## 2013-09-05 DIAGNOSIS — R300 Dysuria: Secondary | ICD-10-CM | POA: Insufficient documentation

## 2013-09-05 DIAGNOSIS — O99891 Other specified diseases and conditions complicating pregnancy: Secondary | ICD-10-CM | POA: Insufficient documentation

## 2013-09-05 DIAGNOSIS — O26899 Other specified pregnancy related conditions, unspecified trimester: Secondary | ICD-10-CM

## 2013-09-05 HISTORY — DX: Gestational diabetes mellitus in pregnancy, unspecified control: O24.419

## 2013-09-05 LAB — URINALYSIS, ROUTINE W REFLEX MICROSCOPIC
Glucose, UA: NEGATIVE mg/dL
Ketones, ur: NEGATIVE mg/dL
Leukocytes, UA: NEGATIVE
Nitrite: NEGATIVE
Urobilinogen, UA: 0.2 mg/dL (ref 0.0–1.0)
pH: 5.5 (ref 5.0–8.0)

## 2013-09-05 MED ORDER — CYCLOBENZAPRINE HCL 10 MG PO TABS
10.0000 mg | ORAL_TABLET | Freq: Once | ORAL | Status: AC
  Administered 2013-09-06: 10 mg via ORAL
  Filled 2013-09-05: qty 1

## 2013-09-05 NOTE — MAU Note (Signed)
Pt reports lower abd pain x 2 days, worsening today. Denies bleeding. Some dysuria.

## 2013-09-06 DIAGNOSIS — O9989 Other specified diseases and conditions complicating pregnancy, childbirth and the puerperium: Secondary | ICD-10-CM

## 2013-09-06 DIAGNOSIS — N949 Unspecified condition associated with female genital organs and menstrual cycle: Secondary | ICD-10-CM

## 2013-09-06 LAB — WET PREP, GENITAL
Clue Cells Wet Prep HPF POC: NONE SEEN
Yeast Wet Prep HPF POC: NONE SEEN

## 2013-09-06 LAB — GC/CHLAMYDIA PROBE AMP: CT Probe RNA: NEGATIVE

## 2013-09-06 NOTE — MAU Provider Note (Signed)
History     CSN: [id number]  Arrival date and time: 09/05/13 2321   First Provider Initiated Contact with Patient 09/06/13 0044      Chief Complaint  Patient presents with  . Abdominal Pain  . Dysuria   Abdominal Pain Associated symptoms include dysuria.  Dysuria     Victoria Richmond is a 27 y.o. G3P2002 at [redacted]w[redacted]d who presents today with bilateral lower abdominal pain. She denies any bleeding or vaginal discharge. She reports occasional pressure when she voids. She states that she has an appointment tomorrow to begin prenatal care at the [facility].   Past Medical History  Diagnosis Date  . Anemia   . Breast pain, left 12/08/2012    Referred patient to the [facility] for left breast ultrasound. Appointment scheduled for Monday, December 14, 2012 at 0850.  Marland Kitchen Gestational diabetes     Past Surgical History  Procedure Laterality Date  . Cesarean section      Family History  Problem Relation Age of Onset  . Diabetes Paternal Aunt   . Diabetes Mother     History  Substance Use Topics  . Smoking status: Never Smoker   . Smokeless tobacco: Never Used  . Alcohol Use: No    Allergies: No Known Allergies  Prescriptions prior to admission  Medication Sig Dispense Refill  . prenatal vitamin w/FE, FA (PRENATAL 1 + 1) 27-1 MG TABS tablet Take 1 tablet by mouth daily at 12 noon.        Review of Systems  Gastrointestinal: Positive for abdominal pain.  Genitourinary: Positive for dysuria.   Physical Exam   Blood pressure 103/54, pulse 73, temperature 98.7 F (37.1 C), temperature source Oral, resp. rate 16, height 4\' 9"  (1.448 m), weight 59.421 kg (131 lb), last menstrual period 11/26/2012, SpO2 100.00%, unknown if currently breastfeeding.  Physical Exam  Nursing note and vitals reviewed. Constitutional: She is oriented to person, place, and time. She appears well-developed and well-nourished. No distress.  Cardiovascular: Normal rate.   Respiratory:  Effort normal.  GI: Soft. There is no tenderness.  Genitourinary:   External: no lesion Vagina: small amount of white discharge Cervix: pink, smooth, closed  Uterus: aga, FHT with doppler     Neurological: She is alert and oriented to person, place, and time.  Skin: Skin is warm and dry.  Psychiatric: She has a normal mood and affect.    MAU Course  Procedures  Results for orders placed during the hospital encounter of 09/05/13 (from the past 24 hour(s))  URINALYSIS, ROUTINE W REFLEX MICROSCOPIC     Status: Abnormal   Collection Time    09/05/13 11:22 PM      Result Value Range   Color, Urine YELLOW  YELLOW   APPearance CLEAR  CLEAR   Specific Gravity, Urine <1.005 (*) 1.005 - 1.030   pH 5.5  5.0 - 8.0   Glucose, UA NEGATIVE  NEGATIVE mg/dL   Hgb urine dipstick NEGATIVE  NEGATIVE   Bilirubin Urine NEGATIVE  NEGATIVE   Ketones, ur NEGATIVE  NEGATIVE mg/dL   Protein, ur NEGATIVE  NEGATIVE mg/dL   Urobilinogen, UA 0.2  0.0 - 1.0 mg/dL   Nitrite NEGATIVE  NEGATIVE   Leukocytes, UA NEGATIVE  NEGATIVE  WET PREP, GENITAL     Status: Abnormal   Collection Time    09/06/13 12:58 AM      Result Value Range   Yeast Wet Prep HPF POC NONE SEEN  NONE SEEN  Trich, Wet Prep NONE SEEN  NONE SEEN   Clue Cells Wet Prep HPF POC NONE SEEN  NONE SEEN   WBC, Wet Prep HPF POC FEW (*) NONE SEEN   Patient is feeling better after flexeril. Assessment and Plan   1. Pain of round ligament complicating pregnancy, antepartum    Follow-up Information   Follow up with [facility] [place]. (as planned )    Contact information:   [address] [place] [address] [phone]       Medication List         prenatal vitamin w/FE, FA 27-1 MG Tabs tablet  Take 1 tablet by mouth daily at 12 noon.       Return to MAU as needed    Tawnya Crook 09/06/2013, 12:57 AM

## 2013-09-09 ENCOUNTER — Other Ambulatory Visit (HOSPITAL_COMMUNITY): Payer: Self-pay | Admitting: Physician Assistant

## 2013-09-09 DIAGNOSIS — Z3689 Encounter for other specified antenatal screening: Secondary | ICD-10-CM

## 2013-09-09 LAB — OB RESULTS CONSOLE ANTIBODY SCREEN: Antibody Screen: NEGATIVE

## 2013-09-09 LAB — OB RESULTS CONSOLE HGB/HCT, BLOOD
HCT: 35 %
Hemoglobin: 11.9 g/dL

## 2013-09-09 LAB — OB RESULTS CONSOLE HIV ANTIBODY (ROUTINE TESTING): HIV: NONREACTIVE

## 2013-09-09 LAB — OB RESULTS CONSOLE VARICELLA ZOSTER ANTIBODY, IGG: Varicella: IMMUNE

## 2013-09-09 LAB — OB RESULTS CONSOLE GC/CHLAMYDIA
Chlamydia: NEGATIVE
Gonorrhea: NEGATIVE

## 2013-09-09 LAB — OB RESULTS CONSOLE HEPATITIS B SURFACE ANTIGEN: HEP B S AG: NEGATIVE

## 2013-09-09 LAB — OB RESULTS CONSOLE PLATELET COUNT: Platelets: 234 10*3/uL

## 2013-09-09 LAB — OB RESULTS CONSOLE ABO/RH: RH Type: POSITIVE

## 2013-09-09 LAB — OB RESULTS CONSOLE RPR: RPR: NONREACTIVE

## 2013-09-09 LAB — OB RESULTS CONSOLE RUBELLA ANTIBODY, IGM: Rubella: IMMUNE

## 2013-09-09 NOTE — MAU Provider Note (Signed)
Attestation of Attending Supervision of Advanced Practitioner: Evaluation and management procedures were performed by the PA/NP/CNM/OB Fellow under my supervision/collaboration. Chart reviewed and agree with management and plan.  Claudio Mondry V 09/09/2013 11:23 AM

## 2013-09-13 LAB — GLUCOSE TOLERANCE, 3 HOURS
Glucose, GTT - 1 Hour: 118 mg/dL (ref ?–200)
Glucose, GTT - 2 Hour: 115 mg/dL (ref ?–140)
Glucose, GTT - 3 Hour: 82 mg/dL (ref ?–140)
Glucose, GTT - Fasting: 60 mg/dL — AB (ref 80–110)

## 2013-09-21 ENCOUNTER — Ambulatory Visit (HOSPITAL_COMMUNITY)
Admission: RE | Admit: 2013-09-21 | Discharge: 2013-09-21 | Disposition: A | Discharge status: Home / Self Care | Payer: Medicaid Other | Source: Ambulatory Visit | Attending: Physician Assistant | Admitting: Physician Assistant

## 2013-09-21 DIAGNOSIS — Z3689 Encounter for other specified antenatal screening: Secondary | ICD-10-CM | POA: Insufficient documentation

## 2013-10-04 ENCOUNTER — Other Ambulatory Visit (HOSPITAL_COMMUNITY): Payer: Self-pay | Admitting: Nurse Practitioner

## 2013-10-04 DIAGNOSIS — Z3689 Encounter for other specified antenatal screening: Secondary | ICD-10-CM

## 2013-10-20 ENCOUNTER — Ambulatory Visit (HOSPITAL_COMMUNITY)
Admission: RE | Admit: 2013-10-20 | Discharge: 2013-10-20 | Disposition: A | Discharge status: Home / Self Care | Payer: Medicaid Other | Source: Ambulatory Visit | Attending: Nurse Practitioner | Admitting: Nurse Practitioner

## 2013-10-20 DIAGNOSIS — O34219 Maternal care for unspecified type scar from previous cesarean delivery: Secondary | ICD-10-CM | POA: Insufficient documentation

## 2013-10-20 DIAGNOSIS — Z3689 Encounter for other specified antenatal screening: Secondary | ICD-10-CM | POA: Insufficient documentation

## 2013-10-20 DIAGNOSIS — O09299 Supervision of pregnancy with other poor reproductive or obstetric history, unspecified trimester: Secondary | ICD-10-CM | POA: Insufficient documentation

## 2013-11-29 LAB — OB RESULTS CONSOLE RPR: RPR: NONREACTIVE

## 2013-11-29 LAB — OB RESULTS CONSOLE HGB/HCT, BLOOD
HCT: 37 %
HEMOGLOBIN: 12.4 g/dL

## 2013-11-29 LAB — OB RESULTS CONSOLE GBS: GBS: NEGATIVE

## 2013-11-29 LAB — GLUCOSE TOLERANCE, 3 HOURS
GLUCOSE 1 HOUR GTT: 230 mg/dL — AB (ref ?–200)
GLUCOSE FASTING GTT: 92 mg/dL (ref 80–110)
Glucose, GTT - 2 Hour: 241 mg/dL — AB (ref ?–140)
Glucose, GTT - 3 Hour: 227 mg/dL — AB (ref ?–140)

## 2013-11-29 LAB — OB RESULTS CONSOLE HIV ANTIBODY (ROUTINE TESTING): HIV: NONREACTIVE

## 2013-12-06 ENCOUNTER — Encounter: Payer: Medicaid Other | Attending: Obstetrics & Gynecology | Admitting: *Deleted

## 2013-12-06 ENCOUNTER — Encounter: Payer: Self-pay | Admitting: *Deleted

## 2013-12-06 ENCOUNTER — Ambulatory Visit (INDEPENDENT_AMBULATORY_CARE_PROVIDER_SITE_OTHER): Payer: Medicaid Other | Admitting: Obstetrics & Gynecology

## 2013-12-06 VITALS — Ht <= 58 in | Wt 145.5 lb

## 2013-12-06 DIAGNOSIS — O24419 Gestational diabetes mellitus in pregnancy, unspecified control: Secondary | ICD-10-CM

## 2013-12-06 DIAGNOSIS — O99810 Abnormal glucose complicating pregnancy: Secondary | ICD-10-CM

## 2013-12-06 NOTE — Progress Notes (Signed)
Nutrition note: 1st visit consult & GDM diet education Pt has h/o GDM in 3 previous pregnancies. Pt has gained 20.5# @ 6947w6d, which is > expected. Pt reports eating 2 meals & 2 snacks/d. Pt is taking PNV. Pt reports no N/V or heartburn. NKFA. Pt received verbal & written education in Spanish via [employer] about GDM diet. Discussed importance of BF. Discussed wt gain goals of 15-25# or 0.6#/wk. Pt agrees to follow GDM diet with 3 meals & 2-3 snacks/d with proper CHO/ protein combination. Pt has WIC & plans to try BF (pt reports did not succeed with other babies). F/u in 2-4 wks Blondell RevealLaura Novali Vollman, MS, RD, LDN, [facility] Center[facility]CLC

## 2013-12-06 NOTE — Progress Notes (Signed)
  Patient was seen on 12/06/13 for Gestational Diabetes self-management    States the definition of Gestational Diabetes  States why dietary management is important in controlling blood glucose  States when to check blood glucose levels  Demonstrates proper blood glucose monitoring techniques  States the effect of stress and exercise on blood glucose levels  Plan:  Consider  increasing your activity level by walking daily as tolerated Begin checking BG before breakfast and 2 hours after first bit of breakfast, lunch and dinner after  as directed by MD  Take medication  as directed by MD  Blood glucose monitor given: [employer] Lot # [id number]  Exp: 07/22/15  Patient instructed to monitor glucose levels: FBS: 60 - <90 2 hour: <120  Patient received the following handouts:  Nutrition Diabetes and Pregnancy  Patient will be seen for follow-up as needed.

## 2013-12-13 ENCOUNTER — Encounter: Payer: Self-pay | Admitting: Obstetrics and Gynecology

## 2013-12-13 ENCOUNTER — Ambulatory Visit (INDEPENDENT_AMBULATORY_CARE_PROVIDER_SITE_OTHER): Payer: Medicaid Other | Admitting: Obstetrics and Gynecology

## 2013-12-13 VITALS — BP 102/65 | Temp 97.0°F | Wt 147.7 lb

## 2013-12-13 DIAGNOSIS — O09899 Supervision of other high risk pregnancies, unspecified trimester: Secondary | ICD-10-CM

## 2013-12-13 DIAGNOSIS — O09299 Supervision of pregnancy with other poor reproductive or obstetric history, unspecified trimester: Secondary | ICD-10-CM

## 2013-12-13 DIAGNOSIS — O34219 Maternal care for unspecified type scar from previous cesarean delivery: Secondary | ICD-10-CM

## 2013-12-13 DIAGNOSIS — O24419 Gestational diabetes mellitus in pregnancy, unspecified control: Secondary | ICD-10-CM | POA: Insufficient documentation

## 2013-12-13 DIAGNOSIS — Z8632 Personal history of gestational diabetes: Secondary | ICD-10-CM

## 2013-12-13 DIAGNOSIS — O99810 Abnormal glucose complicating pregnancy: Secondary | ICD-10-CM

## 2013-12-13 HISTORY — DX: Supervision of other high risk pregnancies, unspecified trimester: O09.899

## 2013-12-13 LAB — POCT URINALYSIS DIP (DEVICE)
Bilirubin Urine: NEGATIVE
Glucose, UA: NEGATIVE mg/dL
HGB URINE DIPSTICK: NEGATIVE
KETONES UR: NEGATIVE mg/dL
Leukocytes, UA: NEGATIVE
Nitrite: NEGATIVE
PROTEIN: NEGATIVE mg/dL
Urobilinogen, UA: 0.2 mg/dL (ref 0.0–1.0)
pH: 5.5 (ref 5.0–8.0)

## 2013-12-13 NOTE — Progress Notes (Signed)
Patient is transferring care from [facility] secondary to GDM. Ob history reviewed. Discussed and explained meaning of GDM along with associated maternal/fetal risks including but not limited to risks of fetal demise. Also discussed the importance of bringing log book and meter with every visit in order to help achieve euglycemia. Patient with h/o GDM with last 2 pregnancies which were diet control. CBGs fasting 90-109 (4/8 abnormal)  2hr pp all within range except for 1 value. Patient advised to consume bedtime snack rich in protein.

## 2013-12-13 NOTE — Progress Notes (Signed)
Pulse- 78  Pressure-vaginal  New ob packet given

## 2013-12-15 ENCOUNTER — Encounter: Payer: Self-pay | Admitting: *Deleted

## 2013-12-27 ENCOUNTER — Ambulatory Visit (INDEPENDENT_AMBULATORY_CARE_PROVIDER_SITE_OTHER): Payer: Medicaid Other | Admitting: Family Medicine

## 2013-12-27 VITALS — BP 94/68 | Temp 97.0°F | Wt 147.4 lb

## 2013-12-27 DIAGNOSIS — O99810 Abnormal glucose complicating pregnancy: Secondary | ICD-10-CM

## 2013-12-27 DIAGNOSIS — O09899 Supervision of other high risk pregnancies, unspecified trimester: Secondary | ICD-10-CM

## 2013-12-27 DIAGNOSIS — O34219 Maternal care for unspecified type scar from previous cesarean delivery: Secondary | ICD-10-CM

## 2013-12-27 DIAGNOSIS — O24419 Gestational diabetes mellitus in pregnancy, unspecified control: Secondary | ICD-10-CM

## 2013-12-27 LAB — POCT URINALYSIS DIP (DEVICE)
Bilirubin Urine: NEGATIVE
Glucose, UA: NEGATIVE mg/dL
HGB URINE DIPSTICK: NEGATIVE
Ketones, ur: NEGATIVE mg/dL
Leukocytes, UA: NEGATIVE
Nitrite: NEGATIVE
PH: 6.5 (ref 5.0–8.0)
PROTEIN: NEGATIVE mg/dL
SPECIFIC GRAVITY, URINE: 1.025 (ref 1.005–1.030)
Urobilinogen, UA: 0.2 mg/dL (ref 0.0–1.0)

## 2013-12-27 NOTE — Patient Instructions (Addendum)
Diabetes mellitus gestacional  (Gestational Diabetes Mellitus) La diabetes mellitus gestacional, ms comnmente conocida como diabetes gestacional es un tipo de diabetes que desarrollan algunas mujeres durante el [place]. En la diabetes gestacional, el pncreas no produce suficiente insulina (una hormona), las clulas son menos sensibles a la insulina que se produce (resistencia a la insulina), o ambos.Normalmente, la Lo[employer]s Corporation azcares de los alimentos a las clulas de los tejidos. Las clulas de los tejidos [employer] azcares para Dealer. La falta de insulina o la falta de una respuesta normal a la insulina hace que el exceso de azcar se acumule en la sangre en lugar de Location manager en las clulas de los tejidos. Como resultado, se desarrollan los niveles altos de Dispensing optician (hiperglucemia). El efecto de los niveles altos de azcar (glucosa) puede causar muchas complicaciones.  FACTORES DE RIESGO Usted tiene mayor probabilidad de desarrollar diabetes gestacional si tiene antecedentes familiares de diabetes y tambin si tiene uno o ms de los siguientes factores de riesgo:   ndice de masa corporal superior a 30 (obesidad).  Embarazo previo con diabetes gestacional.  Mayor edad en el momento del [place]. Si se mantienen los niveles de Multimedia programmer en un rango normal durante el [place], las mujeres pueden tener un embarazo saludable. Si no controla bien sus niveles de glucosa en sangre, pueden tener tener riesgos usted, su beb antes de nacer (feto), el [place] de parto y [place], o el beb recin nacido.  SNTOMAS  Si se presentan sntomas, stos son similares a los sntomas que normalmente experimentar durante el embarazo. Los sntomas de la diabetes gestacional son:   Lovey Newcomer de la sed (polidipsia).  Aumento de ganas de Garment/textile technologist (poliuria).  Aumento de ganas de orinar durante la noche (nicturia).  Prdida de peso. Prdida de [employer] puede ser muy  rpida.  Infecciones frecuentes y recurrentes.  Cansancio (fatiga).  Debilidad.  Cambios en la visin, como visin borrosa.  Olor a Medical illustrator.  Dolor abdominal. DIAGNSTICO La diabetes se diagnostica cuando hay aumento de los niveles de glucosa en la [place]. El nivel de glucosa en la sangre puede controlarse en uno o ms de los siguientes anlisis de sangre:   Medicin de glucosa en sangre en ayunas. No deber comer durante al menos 8 horas antes de que se tome la [place] de [place].  Anlisis al azar de glucosa en sangre. El nivel de glucosa en sangre se controla en cualquier momento del da sin importar el momento en que haya comido.  Pruebas de glucosa de sangre de hemoglobina A1c. Un anlisis de la hemoglobina A1c proporciona informacin sobre el control de la glucosa en la sangre durante los ltimos 3 meses.  Prueba oral de tolerancia a la glucosa (SOG). La glucosa en la sangre se mide despus de no haber comido (ayuno) durante 1-3 horas y despus de beber una bebida que contiene glucosa. Dado que las hormonas que causan la resistencia a la insulina son ms altas alrededor de las semanas 24-28 de [place], generalmente se realiza un SOG durante ese [place]. Si tiene factores de riesgo para la diabetes gestacional, su mdico puede detectarla antes de las 24 semanas de [place]. TRATAMIENTO   Usted tendr que tomar medicamentos para la diabetes o insulina diariamente para Theatre manager los niveles de glucosa en sangre en el rango deseado.  Usted tendr [employer] Noble coincidir la dosis de insulina con el ejercicio y Marine scientist de alimentos saludables. El [place] del tratamiento es  en el rango deseado.  · Usted tendrá que hacer coincidir la dosis de insulina con el ejercicio y la elección de alimentos saludables.  El objetivo del tratamiento es mantener el nivel de glucosa en sangre en 60-99 mg / dl antes de la comida (preprandial), a la hora de acostarse y durante la noche mientras dure su embarazo. El objetivo del tratamiento es mantener el mayor pico de azúcar en sangre después de la comida (glucosa postprandial) en 100-140 mg/dl.   INSTRUCCIONES  PARA EL CUIDADO EN EL HOGAR   · Haga que su nivel de hemoglobina A1c sea verificado dos veces al año.  · Realice un control diario de glucosa en sangre según las indicaciones de su médico. Es común realizar controles con frecuencia de la glucosa en sangre.  · Supervise las cetonas en la orina cuando esté enferma y según las indicaciones de su médico.  · Tome su medicamento para la diabetes y la insulina según las indicaciones de su médico para mantener el nivel de glucosa en sangre en su rango deseado.  · Nunca se quede sin medicamentos para la diabetes o sin insulina. Es necesario recibirla todos los días.  · Ajuste la insulina sobre la base de la ingesta de hidratos de carbono. Los hidratos de carbono pueden aumentar los niveles de glucosa en la sangre, pero deben incluirse en su dieta. Los hidratos de carbono aportan vitaminas, minerales y fibra que son una parte esencial de una dieta saludable. Los hidratos de carbono se encuentran en frutas, verduras, granos enteros, productos lácteos, legumbres y alimentos que contienen azúcares añadidos.  ·   · Consuma alimentos saludables. Alterne 3 comidas con 3 colaciones.  · Mantenga un aumento de peso saludable. El aumento del peso total varía de acuerdo con el índice de masa corporal antes del embarazo (IMC).  · Lleve una tarjeta de alerta médica o lleve una pulsera de alerta médica.  · Lleve consigo un refrigerio de 15 gramos de hidrato de carbonos en todo momento para controlar los niveles bajos de glucosa en sangre (hipoglucemia). Algunos ejemplos de aperitivos de 15 gramos de hidratos de carbono son:  · Tabletas de glucosa, 3 o 4  ·   Gel de glucosa, tubo de 15 gramos  · Pasas de uva, 2 cucharadas (24 g)  · Caramelos de goma, 6  · Galletas de animales, 8  · Jugo de fruta, soda regular, o leche baja en grasa, 4 onzas (120 ml)  · Pastillas gomitas, 9  ·   · Reconocer la hipoglucemia. Durante el embarazo la hipoglucemia se produce cuando hay niveles de glucosa en  sangre de 60 mg/dl o menos. El riesgo de hipoglucemia aumenta durante el ayuno o saltarse las comidas, durante o después del ejercicio intenso, y durante el sueño. Los síntomas de hipoglucemia son:  · Temblores o sacudidas.  · Disminución de capacidad de concentración.  · Sudoración.  · Aumento en el ritmo cardíaco  · Dolor de cabeza.  · Boca seca.  · Hambre.  · Irritabilidad.  · Ansiedad.  · Sueño agitado.  · Alteración del habla o de la coordinación.  · Confusión.  · Tratar la hipoglucemia rápidamente. Si usted está alerta y puede tragar con seguridad, siga la regla de 15:15:  · Tome de 15 a 20 gramos de glucosa de acción rápida o hidratos de carbono . Las opciones de acción rápida son un gel de glucosa, unas tabletas de glucosa, o 4 onzas (120 ml) de jugo de frutas, soda   niveles de glucosa en la sangre vuelven a la normalidad.  Est alerta a la poliuria y polidipsia, que son los primeros signos de hiperglucemia. El conocimiento temprano de la hiperglucemia permite un tratamiento oportuno. Controle la hiperglucemia segn le indic su mdico.  Haga actividad fsica por lo menos 30 minutos al da o como lo indique su mdico. Se recomienda diez minutos de actividad fsica cronometrados 30 minutos despus de cada comida para Chief Operating Officer los niveles de glucosa en sangre postprandial.  Ajuste su dosis de insulina y la ingesta de alimentos, segn sea necesario, si se inicia un nuevo ejercicio o deporte.  Siga su plan diario de enfermo en algn momento que no puede comer o beber como de costumbre.  Evite el tabaco y el alcohol.  Concurra regularmente a las visitas de control con el mdico.  Siga el consejo  de su mdico respecto a los controles prenatales y posteriores al parto (post-parto), a la planificacin de las comidas, al ejercicio, a los [address], a las vitaminas, a los anlisis de [place], a otras pruebas mdicas y fsicas.  Cuide diariamente la piel y los pies. Examine su piel y los pies diariamente para detectar cortes, moretones, enrojecimiento, problemas en las uas, sangrado, ampollas o llagas.  Cepllese los dientes y encas por lo menos dos veces al da y use hilo dental al menos una vez por da. Concurra regularmente a las visitas de control con el dentista.  Programe un examen de vista durante el primer trimestre de su embarazo o como lo indique su mdico.  Comparta su plan de control de diabetes en su trabajo o en la escuela.  Mantngase al da con las vacunas.  Aprenda a Dealer.  Obtenga educacin continuada y ayuda para la diabetes cuando sea necesario. SOLICITE ATENCIN MDICA SI:   No puede comer alimentos o beber por ms de 6 horas.  Tiene nuseas o ha vomitado durante ms de 6 horas.  Tiene un nivel de glucosa en sangre de 200 mg/dl y [employer].  Presenta algn cambio en el estado mental.  Tiene problemas de visin.  Sufre un dolor persistente de Training and development officer.  Tiene dolor o molestias en el abdomen.  Desarrolla una enfermedad grave adicional.  Tiene diarrea durante ms de 6 horas.  Ha estado enferma o ha tenido fiebre durante un par [address] y no mejora. SOLICITE ATENCIN MDICA DE INMEDIATO SI:   Tiene dificultad para respirar.  Ya no siente los movimientos del beb.  Est sangrando o tiene flujo vaginal.  Comienza a tener contracciones o trabajo de Frisbee prematuro. ASEGRESE DE QUE:  Comprende estas instrucciones.  Controlar su enfermedad.  Solicitar ayuda de inmediato si no mejora o si empeora. Document Released: 07/03/2005 Document Revised: 06/17/2012 [facility] Patient Information 2014 [place], [place].  Tercer  trimestre del Psychiatrist  (Third Trimester of Pregnancy) El tercer trimestre del embarazo abarca desde la semana 29 hasta la semana 42, desde el 7 mes hasta el 9. En este trimestre el feto se desarrolla muy rpidamente. Hacia el final del noveno mes, el beb que an no ha nacido mide alrededor de 20 pulgadas (45 cm) de largo y pesa entre 6 y 10 libras (2,700 y 4,500 kg).  CAMBIOS CORPORALES  Su organismo atravesar numerosos cambios durante el [place]. Los cambios varan de una mujer a Educational psychologist.   Seguir American Standard Companies. Es esperable que aumente entre 25 y 35 libras (11 16 kg) hacia el final del [place].  Podrn aparecer las primeras  estras en las caderas, abdomen y [place] Southmamas.  Tendr necesidad de Geographical information systems officerorinar con ms frecuencia porque el feto baja hacia la pelvis y presiona en la vejiga.  Como consecuencia del Psychiatristembarazo, podr sentir Actoracidez estomacal continuamente.  Podr estar constipada ya que ciertas hormonas hacen que los msculos que hacen progresar los desechos a travs de los intestinos trabajen ms lentamente.  Pueden aparecer hemorroides o abultarse e hincharse las venas (venas varicosas).  Podr sentir dolor plvico debido al Citigroupaumento de peso ya que las hormonas del embarazo relajan las articulaciones entre los huesos de la pelvis. El dolor de espalda puede ser consecuencia de la exigencia de los msculos que soportan la Elvastonpostura.  Sus mamas seguirn desarrollndose y estarn ms sensibles. A veces sale Veterinary surgeonuna secrecin amarilla de las Calvinmamas, que se llama Product managercalostro.  El ombligo puede salir hacia afuera.  Podr sentir Wal-Martque le falta el aire debido a que se expande el tero.  Podr notar que el feto "baja" o que se siente ms bajo en el abdomen.  Podr tener una prdida de secrecin mucosa con sangre. Esto suele ocurrir General Electricentre unos [address]pocos das y Neomia Dearuna semana antes del [place]parto.  El cuello se vuelve delgado y blando (se borra) cerca de la fecha de [place]. QU DEBE ESPERAR EN LAS CONSULTAS PRENATALES   Le harn exmenes prenatales cada 2 semanas hasta la semana 36. A partir de ese momento le harn exmenes semanales. Durante una visita prenatal de rutina:   La pesarn para verificar que usted y el feto se encuentran dentro de los lmites normales.  Le tomarn la presin arterial.  Le medirn el abdomen para verificar el desarrollo del beb.  Escucharn los latidos fetales.  Se evaluarn los resultados de los estudios solicitados en visitas anteriores.  Le controlarn el cuello del tero cuando est prxima la fecha de parto para ver si se ha borrado. Alrededor de la semana 36 el mdico controlar el cuello del tero. Al mismo tiempo realizar un anlisis de las secreciones del tejido vaginal. Este examen es para determinar si hay un tipo de bacteria, estreptococo Grupo B. El mdico le explicar esto con ms detalle.  El mdico podr preguntarle:   DIRECTVComo le gustara que fuera el Clearmontparto.  Cmo se siente.  Si siente los movimientos del beb.  Si tiene sntomas anormales, como prdida de lquido, Bartelsosangrado, dolores de cabeza intenso o clicos abdominales.  Si tiene Jerseyalguna duda. Otros estudios que podrn realizarse durante el tercer trimestre son:   Anlisis de sangre para controlar sus niveles de hierro (anemia).  Controles fetales para determinar su salud, el nivel de Saint Vincent and the Grenadinesactividad y su desarrollo. Si tiene Jerseyalguna enfermedad o si tuvo problemas durante el Harrisvilleembarazo, le harn estudios. FALSO TRABAJO DE PARTO  Es posible que sienta contracciones pequeas e irregulares que finalmente desaparecen. Se llaman contracciones de [address] StreetBraxton Hicks o falso trabajo de Springdaleparto. Las contracciones pueden durar horas, [address] o an Retail buyersemanas antes de que el verdadero trabajo de parto se inicie. Si las contracciones tienen intervalos regulares, se intensifican o se hacen dolorosas, lo mejor es que la revise su mdico.  SIGNOS DE TRABAJO DE PARTO   Espasmos del tipo menstrual.  Contracciones cada 5 minutos o  menos.  Contracciones que comienzan en la parte superior del tero y se expanden hacia abajo, a la zona inferior del abdomen y la espalda.  Sensacin de presin que aumenta en la pelvis o dolor en la espalda.  Aparece una secrecin acuosa o sanguinolenta por la vagina. Si  tiene alguno de estos signos antes de la semana 37 del embarazo, llame a su mdico inmediatamente. Debe concurrir al hospital para ser controlada inmediatamente.  INSTRUCCIONES PARA EL CUIDADO EN EL HOGAR   Evite fumar, consumir hierbas, beber alcohol y Chemical engineerutilizar frmacos que no le hayan recetado. Estas sustancias qumicas afectan la formacin y el desarrollo del beb.  Siga las indicaciones del profesional con respecto a Adult nursecomo tomar los medicamentos. Durante el embarazo, hay medicamentos que son seguros y otros no lo son.  Realice actividad fsica slo segn las indicaciones del mdico. Sentir clicos uterinos es el mejor signo para Restaurant manager, fast fooddetener la actividad fsica.  Contine haciendo comidas regulares y sanas.  Use un sostn que le brinde buen soporte si sus mamas estn sensibles.  No utilice la baera con agua caliente, baos turcos o saunas.  Colquese el cinturn de seguridad cuando conduzca.  Evite comer carne cruda queso sin cocinar y el contacto con los utensilios y desperdicios de los gatos. Estos elementos contienen grmenes que pueden causar defectos de nacimiento en el beb.  Tome las vitaminas indicadas para la etapa prenatal.  Pruebe un laxante (si el mdico la autoriza) si tiene constipacin. Consuma ms alimentos ricos en fibra, como vegetales y frutas frescos y cereales enteros. Beba gran cantidad de lquido para mantener la orina de tono claro o amarillo plido.  Tome baos de agua tibia para Primary school teachercalmar el dolor o las molestias causadas por las hemorroides. Use una crema para las hemorroides si el mdico la autoriza.  Si tiene venas varicosas, use medias de soporte. Eleve los pies durante 15 minutos, 3 4 veces  por da. Limite el consumo de sal en su dieta.  Evite levantar objetos pesados, use zapatos de tacones bajos y Brazilmantenga una buena postura.  Descanse con las piernas elevadas si tiene calambres o dolor de cintura.  Visite a su dentista si no lo ha Occupational hygienisthecho durante el embarazo. Use un cepillo de dientes blando para higienizarse los dientes y use suavemente el hilo dental.  Puede continuar su vida sexual excepto que el mdico le indique otra cosa.  No haga viajes largos excepto que sea absolutamente necesario y slo con la aprobacin de su mdico.  Tome clases prenatales para entender, Education administratorpracticar y hacer preguntas sobre el Lyon Mountaintrabajo de parto y el alumbramiento.  Haga un ensayo sobre la partida al hospital.  Prepare el bolso que llevar al hospital.  Prepare la habitacin del beb.  Contine concurriendo a todas las visitas prenatales segn las indicaciones de su mdico. SOLICITE ATENCIN MDICA SI:   No est segura si est en trabajo de parto o ha roto la bolsa de aguas.  Tiene mareos.  Siente clicos leves, presin en la pelvis o dolor persistente en el abdomen.  Tiene nuseas o vmitos persistentes.  Brett Fairybserva una secrecin vaginal con mal olor.  Siente dolor al ConocoPhillipsorinar. SOLICITE ATENCIN MDICA DE INMEDIATO SI:   Tiene fiebre.  Pierde lquido o sangre por la vagina.  Tiene sangrado o pequeas prdidas vaginales.  Siente dolor intenso o clicos en el abdomen.  Sube o baja de peso rpidamente.  Le falta el aire y le duele el pecho al respirar.  Sbitamente se le hincha el rostro, las manos, los tobillos, los pies o las piernas de Rockfordmanera extrema.  No ha sentido los movimientos del beb durante Georgianne Fickuna hora.  Siente un dolor de cabeza intenso que no se alivia con medicamentos.  Su visin se modifica. Document Released: 07/03/2005 Document Revised: 05/26/2013 ExitCare Patient Information  2014 [place] GroveExitCare, [place].  Lactancia materna (Breastfeeding) Decidir Museum/gallery exhibitions officeramamantar es una de las  mejores elecciones que puede hacer por usted y su beb. El cambio hormonal durante el Psychiatristembarazo produce el desarrollo del tejido mamario y Lesothoaumenta la cantidad y el tamao de los conductos galactforos. Estas hormonas tambin permiten que las protenas, los azcares y las grasas de la sangre produzcan la WPS Resourcesleche materna en las glndulas productoras de Timberc[place] Canyonleche. Las hormonas impiden que la leche materna sea liberada antes del nacimiento del beb, adems de impulsar el flujo de leche luego del nacimiento. Una vez que ha comenzado a Museum/gallery exhibitions officeramamantar, Conservation officer, naturepensar en el beb, as Immunologistcomo la succin o Theatre managerel llanto, pueden estimular la liberacin de Burlingameleche de las glndulas productoras de North Salt Lakeleche.  LOS BENEFICIOS DE AMAMANTAR Para el beb  La primera leche (calostro) ayuda al mejor funcionamiento del sistema digestivo del beb.  La leche tiene anticuerpos que ayudan a Radio producerprevenir las infecciones en el beb.  El beb tiene una menor incidencia de asma, alergias y del sndrome de muerte sbita del lactante.  Los nutrientes en la Red Riverleche materna son mejores para el beb que la New Palestineleche maternizada y estn preparados exclusivamente para cubrir las necesidades del beb.  La leche materna mejora el desarrollo cerebral del beb.  Es menos probable que el beb desarrolle otras enfermedades, como obesidad infantil, asma o diabetes mellitus de tipo 2. Para usted   La lactancia materna favorece el desarrollo de un vnculo muy especial entre la madre y el beb.  Es conveniente. Siempre est disponible a la temperatura correcta y es [place] Parkeconmica.  La lactancia materna ayuda a quemar caloras y a perder el peso ganado durante el Euniceembarazo.  Favorece la contraccin del tero al tamao que tena antes del embarazo de manera ms rpida y disminuye el sangrado (loquios) despus del parto.  La lactancia materna contribuye a reducir Nurse, adultel riesgo de desarrollar diabetes mellitus de tipo 2, osteoporosis o cncer de mama o de ovario en el futuro. SIGNOS DE QUE EL  BEB EST HAMBRIENTO Primeros signos de [address]hambre  Aumenta su estado de Lesothoalerta o actividad.  Se estira.  Mueve la cabeza de un lado a otro.  Mueve la cabeza y abre la boca cuando se le toca la mejilla o la comisura de la boca (reflejo de bsqueda).  Aumenta las vocalizaciones, tales como sonidos de succin, se relame los labios, emite arrullos, suspiros, o chirridos.  Mueve la Jones Apparel Groupmano hacia la boca.  Se chupa con ganas los dedos o las manos. Signos tardos de Fisher Scientifichambre  Est agitado.  Llora de manera intermitente. Signos de AES Corporationhambre extrema Los signos de hambre extrema requerirn que lo calme y lo consuele antes de que el beb pueda alimentarse adecuadamente. No espere a que se manifiesten los siguientes signos de hambre extrema para comenzar a Museum/gallery exhibitions officeramamantar:   Designer, jewelleryAgitacin.  Llanto intenso y fuerte.   Gritos. INFORMACIN BSICA SOBRE LA LACTANCIA MATERNA Iniciacin de la lactancia materna  Encuentre un lugar cmodo para sentarse o acostarse, con un buen respaldo para el cuello y la espalda.  Coloque una almohada o una manta enrollada debajo del beb para acomodarlo a la altura de la mama (si est sentada). Las almohadas para Museum/gallery exhibitions officeramamantar se han diseado especialmente a fin de servir de apoyo para los brazos y el beb Smithfield Foodsmientras amamanta.  Asegrese de que el abdomen del beb est frente al suyo.  Masajee suavemente la mama. Con las yemas de los dedos, masajee la pared del pecho hacia el pezn en un movimiento  circular. Esto estimula el flujo de [place]. Es posible que Engineer, manufacturing systems este movimiento mientras amamanta si la leche fluye lentamente.  Sostenga la mama con el pulgar por arriba del pezn y los otros 4 dedos por debajo de la mama. Asegrese de que los dedos se encuentren lejos del pezn y de la boca del beb.  Empuje suavemente los labios del beb con el pezn o con el dedo.  Cuando la boca del beb se abra lo suficiente, acrquelo rpidamente a la mama e introduzca todo el pezn y la  zona oscura que lo rodea (areola), tanto como sea posible, dentro de la boca del beb.  Debe haber ms areola visible por arriba del labio superior del beb que por debajo del labio inferior.  La lengua del beb debe estar entre la enca inferior y la [place].  Asegrese de que la boca del beb est en la posicin correcta alrededor del pezn (prendida). Los labios del beb deben crear un sello sobre la mama, doblndose hacia afuera (invertidos).  Es comn que el beb succione durante 2 a 3 minutos para que comience el flujo de [place]. Cmo debe prenderse Es muy importante que le ensee al beb cmo prenderse adecuadamente a la mama. Si el beb no se prende adecuadamente, puede causarle dolor en el pezn y reducir la produccin de [place], y hacer que el beb tenga un escaso aumento de [place]. Adems, si el beb no se prende adecuadamente al pezn, puede tragar aire durante la alimentacin. Esto puede causarle molestias al beb. Hacer eructar al beb al [place] de mama puede ayudarlo a liberar el aire. Sin embargo, ensearle al beb cmo prenderse a la mama adecuadamente es la mejor manera de evitar que se sienta molesto por tragar Oceanographer se alimenta. Signos de que el beb se ha prendido adecuadamente al pezn:   Payton Doughty o succiona de modo silencioso, sin causarle dolor.  Se escucha que traga cada 3 o 4 succiones.   Hay movimientos musculares por arriba y por delante de sus odos al Printmaker. Signos de que el beb no se ha prendido Audiological scientist al pezn:   Hace ruidos de succin o de chasquido mientras se alimenta.  Dolor en el pezn. Si cree que el beb no se prendi correctamente, deslice el dedo en la comisura de la boca y Ameren Corporation las encas del beb para interrumpir la succin. Intente comenzar a amamantar nuevamente. Signos de Fish farm manager Signos del beb:   Disminucin gradual en el nmero de succiones o cese completo de la succin.  Se  duerme.  Relaja el cuerpo.  Retiene una pequea cantidad de [place] en su boca.  Se desprende solo del pecho. Signos que presenta usted:  Las mamas han aumentado la firmeza, el peso y el tamao 1 a 3 horas despus de Museum/gallery exhibitions officer.  Estn ms blandas inmediatamente despus de amamantar.  Un aumento del volumen de [place], y tambin el cambio de su consistencia y color se producen hacia el quinto da de Tour manager.  Los pezones no duelen, ni estn agrietados ni sangran. Signos de que su beb recibe la cantidad de leche suficiente  Moja al menos 3 paales en 24 horas. La orina debe ser clara y de color amarillo plido a los 5 [address]  [address] de [place].  Defeca al menos 3 veces en 24 horas a los 5 [address]  [address] de [address] La materia fecal debe ser blanda y [place].  Defeca al menos 3 veces en 24 horas a los  7 das de [address] La materia fecal debe ser grumosa y [place].  No registra una prdida de peso mayor del 10% del peso al nacer durante los primeros 3 [address] de [place].  Aumenta de peso un promedio de 4 a 7onzas (120 a ) por semana despus de los 4 [address] de vida.  Aumenta de [place], [place], de [place] consistente a Glass blower/designer de los 5 [address]  [address] de vida, sin Passenger transport manager prdida de peso despus de las 2 semanas de vida. Despus de alimentarse, es posible que el beb regurgite una pequea cantidad. Esto es frecuente. FRECUENCIA Y DURACIN DE LA LACTANCIA MATERNA El amamantamiento frecuente la ayudar a producir ms [place] y a Education officer, community de Engineer, mining en los pezones e hinchazn en las [place]. Alimente al beb cuando muestre signos de hambre o si siente la necesidad de reducir la congestin de las [place]. Esto se denomina "lactancia a demanda". Evite el uso del chupete mientras trabaja para establecer la lactancia (las primeras 4 a 6 semanas despus del nacimiento del beb). Despus de este perodo, podr ofrecerle un chupete. Las investigaciones demostraron que el uso del chupete durante el primer ao de vida del  beb disminuye el riesgo de desarrollar el sndrome de muerte sbita del lactante (SMSL). Permita que el nio se alimente en cada mama todo lo que desee. Contine amamantando al beb hasta que haya terminado de alimentarse. Cuando el beb se desprende o se queda dormido mientras se est alimentando de la primera mama, ofrzcale la segunda. Debido a que, con frecuencia, los recin [place] las primeras semanas de vida, es posible que deba despertar a su beb para alimentarlo. Los horarios de Acupuncturist de un beb a otro. Sin embargo, las siguientes reglas pueden servir como gua para ayudarle a Lawyer que el beb se alimenta adecuadamente:  Se puede amamantar a los recin nacidos (bebs de 4 semanas o menos de vida) cada 1 a 3 horas.  No deben transcurrir ms de 3 horas durante el da o 5 horas durante la noche sin que se amamante a los recin nacidos.  Debe amamantar al beb 8 veces como mnimo, en un perodo de 24 horas, hasta que comience a introducir slidos en su dieta, a los 6 meses de vida aproximadamente. EXTRACCIN DE [employer] MATERNA La extraccin y Contractor de la leche materna le permiten asegurarse de que el beb se alimente exclusivamente de [place], aun en momentos en los que no puede amamantar. Esto tiene especial importancia si debe regresar al Aleen Campi en el perodo en que an est amamantando o si no puede estar presente en los momentos en que el beb debe alimentarse. Su asesor en lactancia puede orientarla sobre cunto tiempo es seguro almacenar [place].  El sacaleche es un aparato que le permite extraer leche de la mama a un recipiente estril. Luego, la leche materna extrada puede almacenarse en un refrigerador o freezer. Algunos sacaleches son Birdie Riddle, Delaney Meigs otros son elctricos. Consulte a su asesor en lactancia qu tipo ser ms conveniente para usted. Los sacaleches se pueden comprar, sin embargo, algunos hospitales y  grupos de apoyo a la lactancia materna alquilan Sports coach. Un asesor en lactancia puede ensearle cmo extraer [employer], en caso de que prefiera no usar un sacaleche.  CMO CUIDAR LAS MAMAS DURANTE LA LACTANCIA MATERNA Los pezones se secan, agrietan y duelen durante la Tour manager. Las siguientes recomendaciones pueden ayudarle a Pharmacologist las [employer] y sanas:  Careers information officer usar [employer]  pezones.  Use un sostn de soporte. Aunque no son esenciales, las camisetas sin mangas o los sostenes especiales para Museum/gallery exhibitions officer estn diseados para acceder fcilmente a las mamas, para Museum/gallery exhibitions officer sin tener que quitarse todo el sostn o la camiseta. Evite usar sostenes con aro o sostenes muy ajustados.  Seque al aire sus pezones durante 3 a despus de amamantar al beb.  Utilice solo apsitos de Haematologist sostn para Environmental health practitioner las prdidas de [place]. La prdida de un poco de [employer] tomas es normal.  Utilice lanolina sobre los pezones luego de Museum/gallery exhibitions officer. La lanolina ayuda a mantener la humedad normal de la piel. Si [place] lanolina pura, no tiene que lavarse los pezones antes de Corporate treasurer al beb. La lanolina pura no es txica para el beb. Adems, puede extraer [employer] algunas gotas de [place] materna y Engineer, maintenance (IT) suavemente esa [employer], para que la [place] se seque al aire. Durante las primeras semanas despus de dar a luz, algunas mujeres pueden experimentar hinchazn en las mamas (congestin [place]). La congestin puede hacer que sienta las mamas pesadas, calientes y sensibles al tacto. El pico de la congestin ocurre dentro de los 3 a 5 das despus del [place]. Las siguientes recomendaciones pueden ayudarle a Paramedic la congestin:  Vace por completo las mamas al [employer] o Environmental health practitioner. Puede aplicar calor hmedo en las mamas (en la ducha o con toallas hmedas para manos) antes de Museum/gallery exhibitions officer o extraer [employer]. Esto aumenta la  circulacin y [place] a que la [place]. Si el beb no vaca por completo las [address] cuando lo [address], extraiga la [place] restante despus de que haya finalizado.  Use un sostn ajustado (para amamantar o comn) o camiseta sin mangas durante 1 o 2 das para indicar al cuerpo que disminuya ligeramente la produccin de [place].  Aplique compresas de hielo [employer], a menos que le resulte demasiado incmodo.  Asegrese de que el beb se encuentre en la posicin correcta mientras lo alimenta. Si la congestin persiste luego de 48 horas o despus de seguir estas recomendaciones, comunquese con su mdico o un Holiday representative. RECOMENDACIONES GENERALES PARA EL CUIDADO DE LA SALUD DURANTE LA LACTANCIA MATERNA  Consuma alimentos saludables. Alterne comidas y colaciones, comiendo 3 de cada Agricultural engineer. Dado que lo que come [employer], es posible que algunas comidas hagan que su beb se vuelva ms irritable de lo habitual. Evite comer este tipo de alimentos, si percibe que afectan de manera negativa al beb.  Beba leche, jugos de fruta y agua para Patent examiner su sed (aproximadamente 10 vasos al Futures trader).  Descanse con frecuencia, reljese y tome sus vitaminas prenatales para evitar la fatiga, el estrs y la anemia.  Contine con los autocontroles de la mama.  Evite masticar y fumar tabaco.  Evite el consumo de alcohol y drogas. Algunos medicamentos, que pueden ser perjudiciales para el beb, pueden pasar a travs de la [employer]. Es importante que consulte a su mdico antes de Medical sales representative, incluidos todos los medicamentos recetados y de [place], as como los suplementos vitamnicos y herbales. Puede quedar embarazada durante la lactancia. Si desea controlar la natalidad, consulte a su mdico cules son las opciones ms seguras para el beb. SOLICITE ATENCIN MDICA SI:   Usted siente que quiere dejar de Museum/gallery exhibitions officer o se siente frustrada con la  lactancia.  Siente dolor en las mamas o en los pezones.  Sus pezones estn agrietados o Water quality scientist.  Sus pechos estn irritados, sensibles o calientes.  Tiene un rea hinchada en cualquiera de las mamas.  Siente escalofros o fiebre.  Tiene nuseas o vmitos.  Presenta una secrecin de otro lquido distinto de la leche materna de los pezones.  Sus mamas no se llenan antes de Museum/gallery exhibitions officer al beb para el 5. da despus del parto.  Se siente triste y deprimida.  El beb est demasiado somnoliento como para comer bien.  El beb tiene problemas para dormir.  Moja menos de 3 paales en 24 horas.  Defeca menos de 3 veces en 24 horas.  La piel del beb o la parte blanca de sus ojos est amarilla.  El beb no ha aumentado de [place] a los [address] de [place]. SOLICITE ATENCIN MDICA DE INMEDIATO SI:   El beb est muy cansado (aletargado) y no se despierta para comer.  Le sube la fiebre sin causa. Document Released: 09/23/2005 Document Revised: 01/18/2013 [facility] Patient Information 2014 [place], [place]. Parto vaginal despus de Eustace Quail (Vaginal Birth After Cesarean Delivery) Un parto vaginal despus de un parto por cesrea es dar a luz por la vagina despus de haber dado a luz por medio de una intervencin [place]. En el pasado, si una mujer tena un beb por cesrea, todos los partos posteriores deban hacerse por cesrea. Esto ya no es as. Puede ser seguro para la mam intentar un parto vaginal luego de una cesrea.  Es importante que converse con su mdico desde comienzos del Psychiatrist de modo que pueda Google, beneficios y opciones. Le dar tiempo para decidir qu es lo mejor en su caso particular. La decisin final de tener un parto vaginal o por cesrea debe tomarse en conjunto, entre usted y el mdico. Cualquier cambio en su salud o la de su beb durante el embarazo puede ser motivo de un cambio de decisin respecto del parto vaginal.  LAS MUJERES QUE OPTAN POR  EL PARTO VAGINAL, DEBEN CONSULTAR AL MDICO PARA ASEGURARSE DE QUE:  La cesrea anterior se haya realizado con un corte (incisin) uterino transversal (no con una incisin vertical clsica).  El canal de parto es lo suficientemente grande como para que pase el [place].  No ha sido sometida a otras operaciones del tero.  Durante el trabajo de parto, le realizarn un monitoreo fetal Forensic scientist, en todo momento.  Habr un quirfano disponible y listo en caso de necesitar una cesrea de emergencia.  Un mdico y personal de quirfano estarn disponibles en todo momento durante el [place] de parto, para realizar una cesrea en caso de ser necesario.  Habr un anestesista disponible en caso de necesitar una cesrea de emergencia.  La nursery est lista y cuenta con personal especializado y el equipo disponible para cuidar al beb en caso de emergencia. BENEFICIOS DEL PARTO VAGINAL:  Permanencia ms breve en el hospital.  Prevencin de los riesgos asociados con el parto por cesrea, por ejemplo:  Complicaciones quirrgicas, como apertura o hernia de la incisin.  Lesiones en otros rganos.  Grant Ruts. Esto puede ocurrir si aparece una infeccin despus de la ciruga. Tambin puede ocurrir como reaccin a los medicamentos administrados para adormecerla durante la [place].  Menos prdida de sangre y menos probabilidad de necesitar una transfusin sangunea.  Menor riesgo de cogulos sanguneos e infeccin.  Tiempo ms corto de recuperacin.  Menor riesgo de remocin del tero (histerectoma).  Menor riesgo de que la placenta cubra parcial o completamente la abertura del tero (placenta previa) en embarazos futuros.  Menos riesgos en el Magazine de parto y [place] futuros. RIESGOS  Ruptura del tero. Esto ocurre en menos del 1% de los partos vaginales. El riesgo de que eso suceda es mayor si:  Se toman medidas para iniciar el proceso del [place] de parto (inducir Engineer, manufacturing systems) o Risk manager o  intensificar las contracciones (aumentar el trabajo de [place]).  Se usan medicamentos para ablandar (madurar) el cuello del tero.  Es necesario extraer el tero (histerectoma) si se rompe. No debe llevarse a cabo si:  La cesrea previa se realiz con una incisin vertical (clsica) o con forma de T, o usted no sabe cul de [employer] han practicado.  Ha sufrido ruptura del tero.  Ha tenido ciertos tipos de Leisure centre manager en el tero, como la extirpacin de fibromas uterinos. Pregntele a su mdico sobre otros tipos de cirugas que le impiden tener un parto vaginal.  Tiene ciertos problemas mdicos o relacionados con el parto (obsttricos).  El beb est en problemas.  Tuvo dos cesreas previas y ningn parto vaginal. OTRAS COSAS QUE DEBE SABER:  La anestesia peridural es segura.  Es seguro dar vuelta al beb si se encuentra de nalgas (intentar una versin ceflica externa).  Es seguro intentarlo en caso de mellizos.  El parto vaginal puede no ser apropiado si el beb pesa 8,8lb (4kg) o ms. Sin embargo, las predicciones de [place] no son siempre exactas y no deben ser lo nico a tenerse en cuenta para decidir si el parto vaginal es lo indicado para usted.  Hay aumento en el porcentaje de fracasos si el intervalo entre la cesrea y el parto vaginal es de menos de 19 meses.  Su mdico puede aconsejarle no tener un parto vaginal si tiene preeclampsia (hipertensin, protena en la orina e hinchazn en la cara y las extremidades).  El parto vaginal suele ser exitoso si ya tuvo un parto vaginal previamente.  Tambin suele ser exitoso cuando el trabajo de parto comienza espontneamente antes de la fecha.  El parto vaginal despus de Eustace Quail es similar a un parto espontneo vaginal normal. Document Released: 03/11/2008 Document Revised: 07/14/2013 ExitCare Patient Information 2014 [place], [place].

## 2013-12-27 NOTE — Progress Notes (Signed)
Pulse- 76 Patient reports occasional cramps

## 2013-12-27 NOTE — Progress Notes (Signed)
FBS 80-90 2 hr pp 63-118   Meter checked--agrees, few 140-150's but rare and per pt, checked too soon post meal. Scheduled for C-section, unsure about BTL--discussed risks and benefits of TOLAC vs. C-section especially after 2 C-sections.  She is not allowed to be induced.  She asks lots of questions, and states that she was not given a choice about TOLAC last pregnancy.  We recommend repeat C-section--she is still undecided about what she will do.

## 2014-01-10 ENCOUNTER — Ambulatory Visit (INDEPENDENT_AMBULATORY_CARE_PROVIDER_SITE_OTHER): Payer: No Typology Code available for payment source | Admitting: Family Medicine

## 2014-01-10 ENCOUNTER — Encounter: Payer: Self-pay | Admitting: Family Medicine

## 2014-01-10 VITALS — BP 119/75 | Wt 153.1 lb

## 2014-01-10 DIAGNOSIS — O34219 Maternal care for unspecified type scar from previous cesarean delivery: Secondary | ICD-10-CM

## 2014-01-10 DIAGNOSIS — O99810 Abnormal glucose complicating pregnancy: Secondary | ICD-10-CM

## 2014-01-10 DIAGNOSIS — O09899 Supervision of other high risk pregnancies, unspecified trimester: Secondary | ICD-10-CM

## 2014-01-10 LAB — POCT URINALYSIS DIP (DEVICE)
Bilirubin Urine: NEGATIVE
Glucose, UA: NEGATIVE mg/dL
HGB URINE DIPSTICK: NEGATIVE
Ketones, ur: NEGATIVE mg/dL
Leukocytes, UA: NEGATIVE
Nitrite: NEGATIVE
PH: 6 (ref 5.0–8.0)
Protein, ur: NEGATIVE mg/dL
Specific Gravity, Urine: 1.01 (ref 1.005–1.030)
UROBILINOGEN UA: 0.2 mg/dL (ref 0.0–1.0)

## 2014-01-10 NOTE — Progress Notes (Signed)
U/S scheduled 01/13/14 at 8 am.

## 2014-01-10 NOTE — Progress Notes (Signed)
P=83 C/o of occasional vaginal pressure and braxton hicks.

## 2014-01-10 NOTE — Progress Notes (Addendum)
Fasting: 86/76/84/85/90/89/73/82/78/102/81/83/82/89/92 2hb 73/100/70/94/95/71/96/115/70/79/100/78/73/73 2hL 96/97/111/84/66/120/7867/70/97/122/94/107/101 2hd 91/95/87/99/81/93/87/99/112/113/80/78/107/104  Almost all sugars at goal on diet alone. Continue current diet Hx of LGA with first pregnancy - order US at 36wk for fetal growth Discussed Tolac and increased risk after 2nd section - pt agrees with c-section.  +FM, no LOF, no VB, 1-2ctx/hr  Erma HeritageYanelis Campos-Godinez is a 28 y.o. G3P2002 at [id number]  here for ROB visit.  Discussed with Patient:  -Plans to breast feed.  All questions answered. -Continue prenatal vitamins. -Reviewed fetal kick counts Pt to perform daily at a time when the baby is active, lie laterally with both hands on belly in quiet room and count all movements (hiccups, shoulder rolls, obvious kicks, etc); pt is to report to clinic L&D for less than 10 movements felt in a one hour time period-pt told as soon as she counts 10 movements the count is complete.  - Routine precautions discussed (depression, infection s/s).   Patient provided with all pertinent phone numbers for emergencies. - RTC for any VB, regular, painful cramps/ctxs occurring at a rate of >2/10 min, fever (100.5 or higher), n/v/d, any pain that is unresolving or worsening, LOF, decreased fetal movement, CP, SOB, edema - RTC in 2 weeks for next appt.   Problems: Patient Active Problem List   Diagnosis Date Noted  . Supervision of other high-risk pregnancy 12/13/2013  . Previous cesarean delivery affecting pregnancy, antepartum 12/13/2013  . Gestational diabetes 12/13/2013  . Breast pain, left 12/08/2012    To Do: 1.   [ ]  Vaccines: rec'd [ ]  BCM: undecided considerng BTL but unsure on price. [ ]  Readiness: baby has a place to sleep, car seat, other baby necessities.

## 2014-01-10 NOTE — Patient Instructions (Signed)
Tercer trimestre del embarazo  (Third Trimester of Pregnancy) El tercer trimestre del embarazo abarca desde la semana 29 hasta la semana 42, desde el 7 mes hasta el 9. En este trimestre el feto se desarrolla muy rpidamente. Hacia el final del noveno mes, el beb que an no ha nacido mide alrededor de 20 pulgadas (45 cm) de largo y pesa entre 6 y 10 libras (2,700 y 4,500 kg).  CAMBIOS CORPORALES  Su organismo atravesar numerosos cambios durante el embarazo. Los cambios varan de una mujer a otra.   Seguir aumentando de peso. Es esperable que aumente entre 25 y 35 libras (11 16 kg) hacia el final del embarazo.  Podrn aparecer las primeras estras en las caderas, abdomen y mamas.  Tendr necesidad de orinar con ms frecuencia porque el feto baja hacia la pelvis y presiona en la vejiga.  Como consecuencia del embarazo, podr sentir acidez estomacal continuamente.  Podr estar constipada ya que ciertas hormonas hacen que los msculos que hacen progresar los desechos a travs de los intestinos trabajen ms lentamente.  Pueden aparecer hemorroides o abultarse e hincharse las venas (venas varicosas).  Podr sentir dolor plvico debido al aumento de peso ya que las hormonas del embarazo relajan las articulaciones entre los huesos de la pelvis. El dolor de espalda puede ser consecuencia de la exigencia de los msculos que soportan la postura.  Sus mamas seguirn desarrollndose y estarn ms sensibles. A veces sale una secrecin amarilla de las mamas, que se llama calostro.  El ombligo puede salir hacia afuera.  Podr sentir que le falta el aire debido a que se expande el tero.  Podr notar que el feto "baja" o que se siente ms bajo en el abdomen.  Podr tener una prdida de secrecin mucosa con sangre. Esto suele ocurrir entre unos pocos das y una semana antes del parto.  El cuello se vuelve delgado y blando (se borra) cerca de la fecha de parto. QU DEBE ESPERAR EN LAS CONSULTAS  PRENATALES  Le harn exmenes prenatales cada 2 semanas hasta la semana 36. A partir de ese momento le harn exmenes semanales. Durante una visita prenatal de rutina:   La pesarn para verificar que usted y el feto se encuentran dentro de los lmites normales.  Le tomarn la presin arterial.  Le medirn el abdomen para verificar el desarrollo del beb.  Escucharn los latidos fetales.  Se evaluarn los resultados de los estudios solicitados en visitas anteriores.  Le controlarn el cuello del tero cuando est prxima la fecha de parto para ver si se ha borrado. Alrededor de la semana 36 el mdico controlar el cuello del tero. Al mismo tiempo realizar un anlisis de las secreciones del tejido vaginal. Este examen es para determinar si hay un tipo de bacteria, estreptococo Grupo B. El mdico le explicar esto con ms detalle.  El mdico podr preguntarle:   Como le gustara que fuera el parto.  Cmo se siente.  Si siente los movimientos del beb.  Si tiene sntomas anormales, como prdida de lquido, sangrado, dolores de cabeza intenso o clicos abdominales.  Si tiene alguna duda. Otros estudios que podrn realizarse durante el tercer trimestre son:   Anlisis de sangre para controlar sus niveles de hierro (anemia).  Controles fetales para determinar su salud, el nivel de actividad y su desarrollo. Si tiene alguna enfermedad o si tuvo problemas durante el embarazo, le harn estudios. FALSO TRABAJO DE PARTO  Es posible que sienta contracciones pequeas e irregulares que finalmente   desaparecen. Se llaman contracciones de Braxton Hicks o falso trabajo de parto. Las contracciones pueden durar horas, das o an semanas antes de que el verdadero trabajo de parto se inicie. Si las contracciones tienen intervalos regulares, se intensifican o se hacen dolorosas, lo mejor es que la revise su mdico.  SIGNOS DE TRABAJO DE PARTO   Espasmos del tipo menstrual.  Contracciones cada 5  minutos o menos.  Contracciones que comienzan en la parte superior del tero y se expanden hacia abajo, a la zona inferior del abdomen y la espalda.  Sensacin de presin que aumenta en la pelvis o dolor en la espalda.  Aparece una secrecin acuosa o sanguinolenta por la vagina. Si tiene alguno de estos signos antes de la semana 37 del embarazo, llame a su mdico inmediatamente. Debe concurrir al hospital para ser controlada inmediatamente.  INSTRUCCIONES PARA EL CUIDADO EN EL HOGAR   Evite fumar, consumir hierbas, beber alcohol y utilizar frmacos que no le hayan recetado. Estas sustancias qumicas afectan la formacin y el desarrollo del beb.  Siga las indicaciones del profesional con respecto a como tomar los medicamentos. Durante el embarazo, hay medicamentos que son seguros y otros no lo son.  Realice actividad fsica slo segn las indicaciones del mdico. Sentir clicos uterinos es el mejor signo para detener la actividad fsica.  Contine haciendo comidas regulares y sanas.  Use un sostn que le brinde buen soporte si sus mamas estn sensibles.  No utilice la baera con agua caliente, baos turcos o saunas.  Colquese el cinturn de seguridad cuando conduzca.  Evite comer carne cruda queso sin cocinar y el contacto con los utensilios y desperdicios de los gatos. Estos elementos contienen grmenes que pueden causar defectos de nacimiento en el beb.  Tome las vitaminas indicadas para la etapa prenatal.  Pruebe un laxante (si el mdico la autoriza) si tiene constipacin. Consuma ms alimentos ricos en fibra, como vegetales y frutas frescos y cereales enteros. Beba gran cantidad de lquido para mantener la orina de tono claro o amarillo plido.  Tome baos de agua tibia para calmar el dolor o las molestias causadas por las hemorroides. Use una crema para las hemorroides si el mdico la autoriza.  Si tiene venas varicosas, use medias de soporte. Eleve los pies durante 15 minutos,  3 4 veces por da. Limite el consumo de sal en su dieta.  Evite levantar objetos pesados, use zapatos de tacones bajos y mantenga una buena postura.  Descanse con las piernas elevadas si tiene calambres o dolor de cintura.  Visite a su dentista si no lo ha hecho durante el embarazo. Use un cepillo de dientes blando para higienizarse los dientes y use suavemente el hilo dental.  Puede continuar su vida sexual excepto que el mdico le indique otra cosa.  No haga viajes largos excepto que sea absolutamente necesario y slo con la aprobacin de su mdico.  Tome clases prenatales para entender, practicar y hacer preguntas sobre el trabajo de parto y el alumbramiento.  Haga un ensayo sobre la partida al hospital.  Prepare el bolso que llevar al hospital.  Prepare la habitacin del beb.  Contine concurriendo a todas las visitas prenatales segn las indicaciones de su mdico. SOLICITE ATENCIN MDICA SI:   No est segura si est en trabajo de parto o ha roto la bolsa de aguas.  Tiene mareos.  Siente clicos leves, presin en la pelvis o dolor persistente en el abdomen.  Tiene nuseas o vmitos persistentes.  Observa una   secrecin vaginal con mal olor.  Siente dolor al orinar. SOLICITE ATENCIN MDICA DE INMEDIATO SI:   Tiene fiebre.  Pierde lquido o sangre por la vagina.  Tiene sangrado o pequeas prdidas vaginales.  Siente dolor intenso o clicos en el abdomen.  Sube o baja de peso rpidamente.  Le falta el aire y le duele el pecho al respirar.  Sbitamente se le hincha el rostro, las manos, los tobillos, los pies o las piernas de manera extrema.  No ha sentido los movimientos del beb durante una hora.  Siente un dolor de cabeza intenso que no se alivia con medicamentos.  Su visin se modifica. Document Released: 07/03/2005 Document Revised: 05/26/2013 ExitCare Patient Information 2014 [employer].  

## 2014-01-13 ENCOUNTER — Ambulatory Visit (HOSPITAL_COMMUNITY)
Admission: RE | Admit: 2014-01-13 | Discharge: 2014-01-13 | Disposition: A | Discharge status: Home / Self Care | Payer: No Typology Code available for payment source | Source: Ambulatory Visit | Attending: Family Medicine | Admitting: Family Medicine

## 2014-01-13 DIAGNOSIS — O34219 Maternal care for unspecified type scar from previous cesarean delivery: Secondary | ICD-10-CM

## 2014-01-13 DIAGNOSIS — O99810 Abnormal glucose complicating pregnancy: Secondary | ICD-10-CM | POA: Insufficient documentation

## 2014-01-13 DIAGNOSIS — O09899 Supervision of other high risk pregnancies, unspecified trimester: Secondary | ICD-10-CM

## 2014-01-17 ENCOUNTER — Encounter: Payer: Self-pay | Admitting: Family Medicine

## 2014-01-24 ENCOUNTER — Encounter: Payer: Self-pay | Admitting: Family Medicine

## 2014-01-24 ENCOUNTER — Ambulatory Visit (INDEPENDENT_AMBULATORY_CARE_PROVIDER_SITE_OTHER): Payer: No Typology Code available for payment source | Admitting: Family Medicine

## 2014-01-24 VITALS — BP 105/73 | HR 84 | Temp 97.2°F | Wt 163.6 lb

## 2014-01-24 DIAGNOSIS — O34219 Maternal care for unspecified type scar from previous cesarean delivery: Secondary | ICD-10-CM

## 2014-01-24 DIAGNOSIS — O09899 Supervision of other high risk pregnancies, unspecified trimester: Secondary | ICD-10-CM

## 2014-01-24 DIAGNOSIS — O99810 Abnormal glucose complicating pregnancy: Secondary | ICD-10-CM

## 2014-01-24 LAB — POCT URINALYSIS DIP (DEVICE)
Bilirubin Urine: NEGATIVE
Glucose, UA: NEGATIVE mg/dL
HGB URINE DIPSTICK: NEGATIVE
Ketones, ur: NEGATIVE mg/dL
LEUKOCYTES UA: NEGATIVE
NITRITE: NEGATIVE
PH: 6 (ref 5.0–8.0)
PROTEIN: NEGATIVE mg/dL
Specific Gravity, Urine: 1.02 (ref 1.005–1.030)
Urobilinogen, UA: 0.2 mg/dL (ref 0.0–1.0)

## 2014-01-24 MED ORDER — GLUCOSE BLOOD VI STRP: ORAL_STRIP | Status: DC

## 2014-01-24 MED ORDER — PRENATAL VITAMINS 28-0.8 MG PO TABS: 1.0000 | ORAL_TABLET | Freq: Every day | ORAL | Status: DC

## 2014-01-24 NOTE — Patient Instructions (Signed)
Tercer trimestre del embarazo  (Third Trimester of Pregnancy) El tercer trimestre del embarazo abarca desde la semana 29 hasta la semana 42, desde el 7 mes hasta el 9. En este trimestre el feto se desarrolla muy rpidamente. Hacia el final del noveno mes, el beb que an no ha nacido mide alrededor de 20 pulgadas (45 cm) de largo y pesa entre 6 y 10 libras (2,700 y 4,500 kg).  CAMBIOS CORPORALES  Su organismo atravesar numerosos cambios durante el embarazo. Los cambios varan de una mujer a otra.   Seguir aumentando de peso. Es esperable que aumente entre 25 y 35 libras (11 16 kg) hacia el final del embarazo.  Podrn aparecer las primeras estras en las caderas, abdomen y mamas.  Tendr necesidad de orinar con ms frecuencia porque el feto baja hacia la pelvis y presiona en la vejiga.  Como consecuencia del embarazo, podr sentir acidez estomacal continuamente.  Podr estar constipada ya que ciertas hormonas hacen que los msculos que hacen progresar los desechos a travs de los intestinos trabajen ms lentamente.  Pueden aparecer hemorroides o abultarse e hincharse las venas (venas varicosas).  Podr sentir dolor plvico debido al aumento de peso ya que las hormonas del embarazo relajan las articulaciones entre los huesos de la pelvis. El dolor de espalda puede ser consecuencia de la exigencia de los msculos que soportan la postura.  Sus mamas seguirn desarrollndose y estarn ms sensibles. A veces sale una secrecin amarilla de las mamas, que se llama calostro.  El ombligo puede salir hacia afuera.  Podr sentir que le falta el aire debido a que se expande el tero.  Podr notar que el feto "baja" o que se siente ms bajo en el abdomen.  Podr tener una prdida de secrecin mucosa con sangre. Esto suele ocurrir entre unos pocos das y una semana antes del parto.  El cuello se vuelve delgado y blando (se borra) cerca de la fecha de parto. QU DEBE ESPERAR EN LAS CONSULTAS  PRENATALES  Le harn exmenes prenatales cada 2 semanas hasta la semana 36. A partir de ese momento le harn exmenes semanales. Durante una visita prenatal de rutina:   La pesarn para verificar que usted y el feto se encuentran dentro de los lmites normales.  Le tomarn la presin arterial.  Le medirn el abdomen para verificar el desarrollo del beb.  Escucharn los latidos fetales.  Se evaluarn los resultados de los estudios solicitados en visitas anteriores.  Le controlarn el cuello del tero cuando est prxima la fecha de parto para ver si se ha borrado. Alrededor de la semana 36 el mdico controlar el cuello del tero. Al mismo tiempo realizar un anlisis de las secreciones del tejido vaginal. Este examen es para determinar si hay un tipo de bacteria, estreptococo Grupo B. El mdico le explicar esto con ms detalle.  El mdico podr preguntarle:   Como le gustara que fuera el parto.  Cmo se siente.  Si siente los movimientos del beb.  Si tiene sntomas anormales, como prdida de lquido, sangrado, dolores de cabeza intenso o clicos abdominales.  Si tiene alguna duda. Otros estudios que podrn realizarse durante el tercer trimestre son:   Anlisis de sangre para controlar sus niveles de hierro (anemia).  Controles fetales para determinar su salud, el nivel de actividad y su desarrollo. Si tiene alguna enfermedad o si tuvo problemas durante el embarazo, le harn estudios. FALSO TRABAJO DE PARTO  Es posible que sienta contracciones pequeas e irregulares que finalmente   desaparecen. Se llaman contracciones de Braxton Hicks o falso trabajo de parto. Las contracciones pueden durar horas, das o an semanas antes de que el verdadero trabajo de parto se inicie. Si las contracciones tienen intervalos regulares, se intensifican o se hacen dolorosas, lo mejor es que la revise su mdico.  SIGNOS DE TRABAJO DE PARTO   Espasmos del tipo menstrual.  Contracciones cada 5  minutos o menos.  Contracciones que comienzan en la parte superior del tero y se expanden hacia abajo, a la zona inferior del abdomen y la espalda.  Sensacin de presin que aumenta en la pelvis o dolor en la espalda.  Aparece una secrecin acuosa o sanguinolenta por la vagina. Si tiene alguno de estos signos antes de la semana 37 del embarazo, llame a su mdico inmediatamente. Debe concurrir al hospital para ser controlada inmediatamente.  INSTRUCCIONES PARA EL CUIDADO EN EL HOGAR   Evite fumar, consumir hierbas, beber alcohol y utilizar frmacos que no le hayan recetado. Estas sustancias qumicas afectan la formacin y el desarrollo del beb.  Siga las indicaciones del profesional con respecto a como tomar los medicamentos. Durante el embarazo, hay medicamentos que son seguros y otros no lo son.  Realice actividad fsica slo segn las indicaciones del mdico. Sentir clicos uterinos es el mejor signo para detener la actividad fsica.  Contine haciendo comidas regulares y sanas.  Use un sostn que le brinde buen soporte si sus mamas estn sensibles.  No utilice la baera con agua caliente, baos turcos o saunas.  Colquese el cinturn de seguridad cuando conduzca.  Evite comer carne cruda queso sin cocinar y el contacto con los utensilios y desperdicios de los gatos. Estos elementos contienen grmenes que pueden causar defectos de nacimiento en el beb.  Tome las vitaminas indicadas para la etapa prenatal.  Pruebe un laxante (si el mdico la autoriza) si tiene constipacin. Consuma ms alimentos ricos en fibra, como vegetales y frutas frescos y cereales enteros. Beba gran cantidad de lquido para mantener la orina de tono claro o amarillo plido.  Tome baos de agua tibia para calmar el dolor o las molestias causadas por las hemorroides. Use una crema para las hemorroides si el mdico la autoriza.  Si tiene venas varicosas, use medias de soporte. Eleve los pies durante 15 minutos,  3 4 veces por da. Limite el consumo de sal en su dieta.  Evite levantar objetos pesados, use zapatos de tacones bajos y mantenga una buena postura.  Descanse con las piernas elevadas si tiene calambres o dolor de cintura.  Visite a su dentista si no lo ha hecho durante el embarazo. Use un cepillo de dientes blando para higienizarse los dientes y use suavemente el hilo dental.  Puede continuar su vida sexual excepto que el mdico le indique otra cosa.  No haga viajes largos excepto que sea absolutamente necesario y slo con la aprobacin de su mdico.  Tome clases prenatales para entender, practicar y hacer preguntas sobre el trabajo de parto y el alumbramiento.  Haga un ensayo sobre la partida al hospital.  Prepare el bolso que llevar al hospital.  Prepare la habitacin del beb.  Contine concurriendo a todas las visitas prenatales segn las indicaciones de su mdico. SOLICITE ATENCIN MDICA SI:   No est segura si est en trabajo de parto o ha roto la bolsa de aguas.  Tiene mareos.  Siente clicos leves, presin en la pelvis o dolor persistente en el abdomen.  Tiene nuseas o vmitos persistentes.  Observa una   secrecin vaginal con mal olor.  Siente dolor al orinar. SOLICITE ATENCIN MDICA DE INMEDIATO SI:   Tiene fiebre.  Pierde lquido o sangre por la vagina.  Tiene sangrado o pequeas prdidas vaginales.  Siente dolor intenso o clicos en el abdomen.  Sube o baja de peso rpidamente.  Le falta el aire y le duele el pecho al respirar.  Sbitamente se le hincha el rostro, las manos, los tobillos, los pies o las piernas de manera extrema.  No ha sentido los movimientos del beb durante una hora.  Siente un dolor de cabeza intenso que no se alivia con medicamentos.  Su visin se modifica. Document Released: 07/03/2005 Document Revised: 05/26/2013 ExitCare Patient Information 2014 [employer].  

## 2014-01-24 NOTE — Progress Notes (Signed)
F        86/98/89/82/85/78/84 2hB   100/81/97/76/78/81/72 2HL    67/101/79/113/75/80/75 2HD   86/87/102/76/76/75 Diet controlled. No medications required  +FM, no lof, no vb, ~4/day ctx Yesterday started having increased frequency with urination, burning, vaginal itching. No fevers, no chills, no back pain - wet mount, GC/C, urine Cx. GBS today  Bilateral calf welling, equal 35cm bil  Victoria Richmond is a 28 y.o. G3P2002 at 4828w6d here for ROB visit.  Discussed with Patient:  -Plans to breast feed.  All questions answered. -Continue prenatal vitamins. -Reviewed fetal kick counts Pt to perform daily at a time when the baby is active, lie laterally with both hands on belly in quiet room and count all movements (hiccups, shoulder rolls, obvious kicks, etc); pt is to report to clinic L&D for less than 10 movements felt in a one hour time period-pt told as soon as she counts 10 movements the count is complete.  - Routine precautions discussed (depression, infection s/s).   Patient provided with all pertinent phone numbers for emergencies. - RTC for any VB, regular, painful cramps/ctxs occurring at a rate of >2/10 min, fever (100.5 or higher), n/v/d, any pain that is unresolving or worsening, LOF, decreased fetal movement, CP, SOB, edema - RTC in 2 weeks for next appt. - Did GBS swabs today and will f/u results and call if abnormal.  Contact#:  Pt has no drug allergies, so will get PCN if GBS+ OR will get sensitivities on GBS swab as pt is PCN-allergic.  Problems: Patient Active Problem List   Diagnosis Date Noted  . Supervision of other high-risk pregnancy 12/13/2013  . Previous cesarean delivery affecting pregnancy, antepartum 12/13/2013  . Gestational diabetes - Deliver at 39 wks 12/13/2013  . Breast pain, left 12/08/2012    To Do: 1. Repeat csection @ 39wk  [ ]  Vaccines: recd [x ] BCM: mirena [x ] Readiness: baby has a place to sleep, car seat, other baby necessities.  Edu:  [x]  PTL precautions; [ ]  BF class; [ ]  childbirth class; [ ]   BF counseling;

## 2014-01-25 LAB — GC/CHLAMYDIA PROBE AMP
CT Probe RNA: NEGATIVE
GC Probe RNA: NEGATIVE

## 2014-01-25 LAB — WET PREP, GENITAL
CLUE CELLS WET PREP: NONE SEEN
Trich, Wet Prep: NONE SEEN
WBC, Wet Prep HPF POC: NONE SEEN
Yeast Wet Prep HPF POC: NONE SEEN

## 2014-01-26 LAB — CULTURE, BETA STREP (GROUP B ONLY)

## 2014-01-28 ENCOUNTER — Inpatient Hospital Stay (HOSPITAL_COMMUNITY): Payer: No Typology Code available for payment source

## 2014-01-28 ENCOUNTER — Inpatient Hospital Stay (HOSPITAL_COMMUNITY)
Admission: AD | Admit: 2014-01-28 | Discharge: 2014-01-28 | Disposition: A | Discharge status: Home / Self Care | Payer: No Typology Code available for payment source | Source: Ambulatory Visit | Attending: Obstetrics & Gynecology | Admitting: Obstetrics & Gynecology

## 2014-01-28 ENCOUNTER — Encounter (HOSPITAL_COMMUNITY): Payer: Self-pay | Admitting: *Deleted

## 2014-01-28 DIAGNOSIS — O479 False labor, unspecified: Secondary | ICD-10-CM

## 2014-01-28 DIAGNOSIS — Z833 Family history of diabetes mellitus: Secondary | ICD-10-CM | POA: Insufficient documentation

## 2014-01-28 DIAGNOSIS — O34219 Maternal care for unspecified type scar from previous cesarean delivery: Secondary | ICD-10-CM | POA: Insufficient documentation

## 2014-01-28 DIAGNOSIS — O09899 Supervision of other high risk pregnancies, unspecified trimester: Secondary | ICD-10-CM

## 2014-01-28 DIAGNOSIS — O99810 Abnormal glucose complicating pregnancy: Secondary | ICD-10-CM | POA: Insufficient documentation

## 2014-01-28 DIAGNOSIS — O471 False labor at or after 37 completed weeks of gestation: Secondary | ICD-10-CM

## 2014-01-28 NOTE — MAU Note (Signed)
Pt reports contractions since last pm.

## 2014-01-28 NOTE — MAU Provider Note (Signed)
  History     CSN: [id number]  Arrival date and time: 01/28/14 2022   First Provider Initiated Contact with Patient 01/28/14 2219      Chief Complaint  Patient presents with  . Labor Eval   HPI Victoria Richmond is a 28yo [id number] at 37.3wks who presents for eval of ctx. Denies leak or bldg. [facility] and has been remarkable for 1) prev C/S x 2 with plans for repeat 2) A1 GDM.  OB History   Grav Para Term Preterm Abortions TAB SAB Ect Mult Living   3 2 2       2       Past Medical History  Diagnosis Date  . Anemia   . Breast pain, left 12/08/2012    [facility]. Appointment scheduled for Monday, December 14, 2012 at 0850.  Marland Kitchen. Gestational diabetes     Past Surgical History  Procedure Laterality Date  . Cesarean section      Family History  Problem Relation Age of Onset  . Diabetes Paternal Aunt   . Diabetes Mother     History  Substance Use Topics  . Smoking status: Never Smoker   . Smokeless tobacco: Never Used  . Alcohol Use: No    Allergies: No Known Allergies  Prescriptions prior to admission  Medication Sig Dispense Refill  . glucose blood (TRUE CARE TEST STRIP PACK) test strip Use as instructed  100 each  12  . Prenatal Vit-Fe Fumarate-FA (PRENATAL VITAMINS) 28-0.8 MG TABS Take 1 tablet by mouth daily.  30 tablet  12    ROS Physical Exam   Blood pressure 118/79, pulse 71, temperature 98.4 F (36.9 C), temperature source Oral, resp. rate 18, height 4\' 10"  (1.473 m), weight 75.751 kg (167 lb), last menstrual period 05/11/2013, SpO2 100.00%.  Physical Exam  Constitutional: She is oriented to person, place, and time. She appears well-developed.  HENT:  Head: Normocephalic.  Cardiovascular: Normal rate.   Respiratory: Effort normal.  GI:  EFM 150s with some 15x15 accels, occ mi variables, approx 15 mins with baseline 170 with accels, then back to 150  Ctx: mostly  irritability; no reg ctx pattern  Genitourinary:  Cx FT/50/-3/post x 3 exams over 3 hr period  Musculoskeletal: Normal range of motion.  Neurological: She is alert and oriented to person, place, and time.  Skin: Skin is warm and dry.  Psychiatric: She has a normal mood and affect. Her behavior is normal. Thought content normal.   BPP: 8/8, subj nl AFI with largest pocket 8.3cm  MAU Course  Procedures    Assessment and Plan  IUP at 37.3wks Planned repeat C/S Ctx without cx change  D/C home with labor precautions/ROM/bldg Keep next scheduled appt   Arabella MerlesKimberly D Shaw CNM 01/28/2014, 10:24 PM

## 2014-01-28 NOTE — Discharge Instructions (Signed)
Contracciones de Braxton Hicks  (Braxton Hicks Contractions)  Usted presenta un falso trabajo de parto. Durante todo el embarazo aparecen con frecuencia contracciones del útero. Hacia el final del embarazo (32-34 semanas) estas contracciones (Braxton Hicks) pueden hacerse más fuertes. No se trata de un trabajo de parto verdadero porque no producen un agrandamiento (dilatación) y afinamiento del cuello del útero. Algunas veces resulta difícil distinguirlas del trabajo de parto verdadero porque en algunos casos llegan a ser muy intensas y las personas tienen distinta tolerancia al dolor. No debe sentirse avergonzada si ingresa al hospital con un falso trabajo de parto. En ocasiones la única forma de saber si está en un parto verdadero es observar los cambios en el cuello del útero. A veces, la única forma de saber si realmente está en trabajo de parto es para el médico observar los cambios en el útero.  Como diferenciar el trabajo de parto falso del verdadero:  · Trabajos de parto falso.  · Las contracciones falsas generalmente duran menos y no son tan intensas como las verdaderas.  · Generalmente se sienten en la zona inferior del abdomen y en la ingle.  · Pueden aliviarse con una caminata o cambiar de posición mientras se está acostada.  · A medida que pasa el tiempo son más cortas y débiles.  · Generalmente son irregulares.  · No se hacen progresivamente más intensas y cercanas entre sí como las verdaderas.  · Trabajo de parto verdadero.  · Las contracciones verdaderas duran de 30 a 70 segundos, son más regulares, generalmente se hacen más intensas y aumentan en frecuencia.  · No desaparecen al caminar.  · La molestia generalmente se siente en la parte superior del útero y se extiende hacia la zona inferior del abdomen y hacia la cintura.  · El profesional que la asiste podrá examinarla para determinar si el trabajo de parto es verdadero. El examen mostrará si el cuello del útero se está dilatando y afinando.  Si  no hay problemas prenatales u otras complicaciones de la salud asociadas al embarazo, no habrá inconvenientes si la envían a su casa y espera el comienzo del verdadero trabajo de parto.  INSTRUCCIONES PARA EL CUIDADO DOMICILIARIO  · Siga con los ejercicios y las indicaciones habituales.  · Tome los medicamentos como se le indicó.  · Cumpla con las citas regularmente.  · Coma y beba ligero si cree que dará a luz.  · Si se siente incómoda por las contracciones:  · Cambie de actividad, si está acostada o en reposo, camine y si está caminando, repose.  · Siéntense y repose en una bañadera con agua caliente.  · Beba entre 2 y 3 vasos de agua. La deshidratación puede causar contracciones BH.  · Respire lenta y profundamente varias veces por hora.  SOLICITE ATENCIÓN MÉDICA DE INMEDIATO SI:  · Las contracciones se intensifican, se hacen más regulares y cercanas entre sí.  · Tiene una pérdida importante de líquido de la vagina  · La temperatura oral se eleva sin motivo por encima de 38,9° C (102° F) o según le indique el profesional que la asiste.  · Elimina una mucosidad sanguinolenta.  · Presenta hemorragia vaginal.  · Presenta dolor abdominal constante.  · Siente un dolor en la parte baja de la espalda que nunca había sentido antes.  · Siente que el bebé empuja hacia abajo y le causa presión pélvica.  · El bebé no se mueve tanto como antes.  Document Released: 07/03/2005 Document Revised: 12/16/2011    ExitCare® Patient Information ©2014 [employer].

## 2014-01-29 ENCOUNTER — Encounter (HOSPITAL_COMMUNITY): Payer: Medicaid Other | Admitting: Anesthesiology

## 2014-01-29 ENCOUNTER — Inpatient Hospital Stay (HOSPITAL_COMMUNITY)
Admission: AD | Admit: 2014-01-29 | Discharge: 2014-02-01 | DRG: 765 | Disposition: A | Discharge status: Home / Self Care | Payer: Medicaid Other | Source: Ambulatory Visit | Attending: Obstetrics & Gynecology | Admitting: Obstetrics & Gynecology

## 2014-01-29 ENCOUNTER — Encounter (HOSPITAL_COMMUNITY): Payer: Self-pay | Admitting: *Deleted

## 2014-01-29 ENCOUNTER — Encounter (HOSPITAL_COMMUNITY)
Admission: AD | Disposition: A | Discharge status: Home / Self Care | Payer: Self-pay | Source: Ambulatory Visit | Attending: Obstetrics & Gynecology

## 2014-01-29 ENCOUNTER — Inpatient Hospital Stay (HOSPITAL_COMMUNITY): Payer: Medicaid Other | Admitting: Anesthesiology

## 2014-01-29 DIAGNOSIS — Z98891 History of uterine scar from previous surgery: Secondary | ICD-10-CM

## 2014-01-29 DIAGNOSIS — O34219 Maternal care for unspecified type scar from previous cesarean delivery: Principal | ICD-10-CM | POA: Diagnosis present

## 2014-01-29 DIAGNOSIS — O99810 Abnormal glucose complicating pregnancy: Secondary | ICD-10-CM

## 2014-01-29 DIAGNOSIS — N9989 Other postprocedural complications and disorders of genitourinary system: Secondary | ICD-10-CM | POA: Diagnosis not present

## 2014-01-29 DIAGNOSIS — O99814 Abnormal glucose complicating childbirth: Secondary | ICD-10-CM | POA: Diagnosis present

## 2014-01-29 DIAGNOSIS — O09899 Supervision of other high risk pregnancies, unspecified trimester: Secondary | ICD-10-CM

## 2014-01-29 HISTORY — PX: BLADDER NECK RECONSTRUCTION: SHX1239

## 2014-01-29 LAB — TYPE AND SCREEN
ABO/RH(D): O POS
Antibody Screen: NEGATIVE

## 2014-01-29 LAB — CBC
HCT: 38.6 % (ref 36.0–46.0)
Hemoglobin: 13.1 g/dL (ref 12.0–15.0)
MCH: 30 pg (ref 26.0–34.0)
MCHC: 33.9 g/dL (ref 30.0–36.0)
MCV: 88.5 fL (ref 78.0–100.0)
Platelets: 165 10*3/uL (ref 150–400)
RBC: 4.36 MIL/uL (ref 3.87–5.11)
RDW: 13.9 % (ref 11.5–15.5)
WBC: 6.6 10*3/uL (ref 4.0–10.5)

## 2014-01-29 LAB — RAPID HIV SCREEN (WH-MAU): Rapid HIV Screen: NONREACTIVE

## 2014-01-29 LAB — GLUCOSE, CAPILLARY: Glucose-Capillary: 68 mg/dL — ABNORMAL LOW (ref 70–99)

## 2014-01-29 SURGERY — Surgical Case
Anesthesia: Spinal | Site: Abdomen

## 2014-01-29 MED ORDER — BUPIVACAINE IN DEXTROSE 0.75-8.25 % IT SOLN
INTRATHECAL | Status: DC | PRN
  Administered 2014-01-29: 10.5 mg via INTRATHECAL

## 2014-01-29 MED ORDER — ONDANSETRON HCL 4 MG/2ML IJ SOLN
INTRAMUSCULAR | Status: AC
  Filled 2014-01-29: qty 2

## 2014-01-29 MED ORDER — CITRIC ACID-SODIUM CITRATE 334-500 MG/5ML PO SOLN
ORAL | Status: AC
  Filled 2014-01-29: qty 15

## 2014-01-29 MED ORDER — FENTANYL CITRATE 0.05 MG/ML IJ SOLN
INTRAMUSCULAR | Status: AC
  Administered 2014-01-29: 100 ug via INTRAVENOUS
  Filled 2014-01-29: qty 2

## 2014-01-29 MED ORDER — CEFAZOLIN SODIUM-DEXTROSE 2-3 GM-% IV SOLR
2.0000 g | Freq: Once | INTRAVENOUS | Status: AC
  Administered 2014-01-29: 2 g via INTRAVENOUS
  Filled 2014-01-29: qty 50

## 2014-01-29 MED ORDER — OXYTOCIN 10 UNIT/ML IJ SOLN
INTRAMUSCULAR | Status: AC
  Filled 2014-01-29: qty 4

## 2014-01-29 MED ORDER — FENTANYL CITRATE 0.05 MG/ML IJ SOLN
100.0000 ug | INTRAMUSCULAR | Status: DC | PRN
  Administered 2014-01-29 (×2): 100 ug via INTRAVENOUS
  Filled 2014-01-29: qty 2

## 2014-01-29 MED ORDER — MORPHINE SULFATE (PF) 0.5 MG/ML IJ SOLN
INTRAMUSCULAR | Status: DC | PRN
  Administered 2014-01-29: .1 mg via INTRATHECAL

## 2014-01-29 MED ORDER — ONDANSETRON HCL 4 MG/2ML IJ SOLN
INTRAMUSCULAR | Status: DC | PRN
  Administered 2014-01-29: 4 mg via INTRAVENOUS

## 2014-01-29 MED ORDER — PHENYLEPHRINE 8 MG IN D5W 100 ML (0.08MG/ML) PREMIX OPTIME
INJECTION | INTRAVENOUS | Status: DC | PRN
  Administered 2014-01-29: 60 ug/min via INTRAVENOUS

## 2014-01-29 MED ORDER — BUPIVACAINE-EPINEPHRINE (PF) 0.5% -1:200000 IJ SOLN
INTRAMUSCULAR | Status: AC
Start: 2014-01-29 — End: 2014-01-29
  Filled 2014-01-29: qty 30

## 2014-01-29 MED ORDER — BUPIVACAINE HCL (PF) 0.5 % IJ SOLN
INTRAMUSCULAR | Status: AC
  Filled 2014-01-29: qty 30

## 2014-01-29 MED ORDER — FENTANYL CITRATE 0.05 MG/ML IJ SOLN
INTRAMUSCULAR | Status: AC
  Filled 2014-01-29: qty 2

## 2014-01-29 MED ORDER — LACTATED RINGERS IV SOLN
INTRAVENOUS | Status: DC | PRN
  Administered 2014-01-29: 23:00:00 via INTRAVENOUS

## 2014-01-29 MED ORDER — LACTATED RINGERS IV SOLN
INTRAVENOUS | Status: DC | PRN
  Administered 2014-01-29 (×4): via INTRAVENOUS

## 2014-01-29 MED ORDER — OXYTOCIN 10 UNIT/ML IJ SOLN
40.0000 [IU] | INTRAVENOUS | Status: DC | PRN
  Administered 2014-01-29: 40 [IU] via INTRAVENOUS

## 2014-01-29 MED ORDER — FENTANYL CITRATE 0.05 MG/ML IJ SOLN
INTRAMUSCULAR | Status: DC | PRN
  Administered 2014-01-29: 12.5 ug via INTRAVENOUS

## 2014-01-29 MED ORDER — MORPHINE SULFATE 0.5 MG/ML IJ SOLN
INTRAMUSCULAR | Status: AC
  Filled 2014-01-29: qty 10

## 2014-01-29 MED ORDER — LACTATED RINGERS IV BOLUS (SEPSIS): 1000.0000 mL | Freq: Once | INTRAVENOUS | Status: DC

## 2014-01-29 MED ORDER — CITRIC ACID-SODIUM CITRATE 334-500 MG/5ML PO SOLN
30.0000 mL | Freq: Once | ORAL | Status: AC
  Administered 2014-01-29: 30 mL via ORAL
  Filled 2014-01-29: qty 15

## 2014-01-29 MED ORDER — PHENYLEPHRINE 8 MG IN D5W 100 ML (0.08MG/ML) PREMIX OPTIME
INJECTION | INTRAVENOUS | Status: AC
  Filled 2014-01-29: qty 100

## 2014-01-29 SURGICAL SUPPLY — 40 items
CLAMP CORD UMBIL (MISCELLANEOUS) IMPLANT
CLOTH BEACON ORANGE TIMEOUT ST (SAFETY) ×3 IMPLANT
DRAPE LG THREE QUARTER DISP (DRAPES) IMPLANT
DRSG OPSITE POSTOP 4X10 (GAUZE/BANDAGES/DRESSINGS) ×3 IMPLANT
DURAPREP 26ML APPLICATOR (WOUND CARE) ×3 IMPLANT
ELECT REM PT RETURN 9FT ADLT (ELECTROSURGICAL) ×3
ELECTRODE REM PT RTRN 9FT ADLT (ELECTROSURGICAL) ×1 IMPLANT
EXTRACTOR VACUUM M CUP 4 TUBE (SUCTIONS) IMPLANT
EXTRACTOR VACUUM M CUP 4' TUBE (SUCTIONS)
GLOVE BIOGEL PI IND STRL 7.0 (GLOVE) ×1 IMPLANT
GLOVE BIOGEL PI INDICATOR 7.0 (GLOVE) ×2
GLOVE ECLIPSE 7.0 STRL STRAW (GLOVE) ×3 IMPLANT
GOWN STRL REUS W/TWL LRG LVL3 (GOWN DISPOSABLE) ×6 IMPLANT
HEMOSTAT SURGICEL 2X14 (HEMOSTASIS) ×2 IMPLANT
KIT ABG SYR 3ML LUER SLIP (SYRINGE) IMPLANT
NDL HYPO 25X5/8 SAFETYGLIDE (NEEDLE) ×1 IMPLANT
NEEDLE HYPO 22GX1.5 SAFETY (NEEDLE) ×3 IMPLANT
NEEDLE HYPO 25X5/8 SAFETYGLIDE (NEEDLE) ×3 IMPLANT
NS IRRIG 1000ML POUR BTL (IV SOLUTION) ×3 IMPLANT
PACK C SECTION WH (CUSTOM PROCEDURE TRAY) ×3 IMPLANT
PAD ABD 7.5X8 STRL (GAUZE/BANDAGES/DRESSINGS) ×3 IMPLANT
PAD ABD 8X7 1/2 STERILE (GAUZE/BANDAGES/DRESSINGS) ×2 IMPLANT
PAD OB MATERNITY 4.3X12.25 (PERSONAL CARE ITEMS) ×3 IMPLANT
RTRCTR C-SECT PINK 25CM LRG (MISCELLANEOUS) ×2 IMPLANT
SPONGE GAUZE 4X4 12PLY STER LF (GAUZE/BANDAGES/DRESSINGS) ×2 IMPLANT
STAPLER VISISTAT 35W (STAPLE) IMPLANT
SUT PDS AB 0 CT1 27 (SUTURE) ×2 IMPLANT
SUT PDS AB 0 CTX 36 PDP370T (SUTURE) IMPLANT
SUT PLAIN 2 0 XLH (SUTURE) ×2 IMPLANT
SUT VIC AB 0 CT1 36 (SUTURE) ×6 IMPLANT
SUT VIC AB 0 CTX 36 (SUTURE) ×9
SUT VIC AB 0 CTX36XBRD ANBCTRL (SUTURE) ×2 IMPLANT
SUT VIC AB 2-0 SH 27 (SUTURE) ×6
SUT VIC AB 2-0 SH 27XBRD (SUTURE) IMPLANT
SUT VIC AB 4-0 KS 27 (SUTURE) ×3 IMPLANT
SYR 30ML LL (SYRINGE) ×3 IMPLANT
TAPE HYPAFIX 4 X10 (GAUZE/BANDAGES/DRESSINGS) ×2 IMPLANT
TOWEL OR 17X24 6PK STRL BLUE (TOWEL DISPOSABLE) ×3 IMPLANT
TRAY FOLEY CATH 14FR (SET/KITS/TRAYS/PACK) ×3 IMPLANT
WATER STERILE IRR 1000ML POUR (IV SOLUTION) ×3 IMPLANT

## 2014-01-29 NOTE — Anesthesia Procedure Notes (Signed)
Spinal Patient location during procedure: OR Staffing Anesthesiologist: Vena Bassinger Performed by: anesthesiologist  Preanesthetic Checklist Completed: patient identified, site marked, surgical consent, pre-op evaluation, timeout performed, IV checked, risks and benefits discussed and monitors and equipment checked Spinal Block Patient position: sitting Prep: ChloraPrep Patient monitoring: heart rate, continuous pulse ox and blood pressure Approach: right paramedian Location: L4-5 Injection technique: single-shot Needle Needle type: Sprotte  Needle gauge: 24 G Needle length: 9 cm Additional Notes Expiration date of kit checked and confirmed. Patient tolerated procedure well, without complications.     

## 2014-01-29 NOTE — H&P (Signed)
Attestation of Attending Supervision of Obstetric Fellow: Evaluation and management procedures were performed by the Obstetric Fellow under my supervision and collaboration.  I have reviewed the Obstetric Fellow's note and chart, and I agree with the management and plan.   The risks of cesarean section discussed with the patient ,with the help of a Spanish interpreter, included but were not limited to: bleeding which may require transfusion or reoperation; infection which may require antibiotics; injury to bowel, bladder, ureters or other surrounding organs; injury to the fetus; need for additional procedures including hysterectomy in the event of a life-threatening hemorrhage; placental abnormalities wth subsequent pregnancies, incisional problems, thromboembolic phenomenon and other postoperative/anesthesia complications. The patient concurred with the proposed plan, giving informed written consent for the procedure.   Patient has been NPO since 1430 she will remain NPO for procedure. Anesthesia and OR aware. Preoperative prophylactic antibiotics and SCDs ordered on call to the OR.  To OR when ready.   Jaynie CollinsUGONNA  Tacia Hindley, MD, FACOG Attending Obstetrician & Gynecologist Faculty Practice, [facility]

## 2014-01-29 NOTE — H&P (Signed)
Victoria Richmond is a 28 y.o. female G3P2002 with IUP at 5149w4d presenting for painful contractions that started this morning at 0800. Pt reports they have been getting closer together since onset. Pt also reports good fetal movement, no LOF, no VB. [facility]  [facility] 30+6 wks. Previously at [facility]  Prenatal History/Complications: Prior c-sections x2 GDMA1 - well controlled sugars  Past Medical History: Past Medical History  Diagnosis Date  . Anemia   . Breast pain, left 12/08/2012    [facility]. Appointment scheduled for Monday, December 14, 2012 at 0850.  Marland Kitchen. Gestational diabetes     Past Surgical History: Past Surgical History  Procedure Laterality Date  . Cesarean section      Obstetrical History: OB History   Grav Para Term Preterm Abortions TAB SAB Ect Mult Living   3 2 2       2       Gynecological History: OB History   Grav Para Term Preterm Abortions TAB SAB Ect Mult Living   3 2 2       2       Social History: History   Social History  . Marital Status: Single    Spouse Name: N/A    Number of Children: N/A  . Years of Education: N/A   Social History Main Topics  . Smoking status: Never Smoker   . Smokeless tobacco: Never Used  . Alcohol Use: No  . Drug Use: No  . Sexual Activity: Yes    Birth Control/ Protection: None   Other Topics Concern  . None   Social History Narrative  . None    Family History: Family History  Problem Relation Age of Onset  . Diabetes Paternal Aunt   . Diabetes Mother     Allergies: No Known Allergies  Prescriptions prior to admission  Medication Sig Dispense Refill  . glucose blood (TRUE CARE TEST STRIP PACK) test strip Use as instructed  100 each  12  . Prenatal Vit-Fe Fumarate-FA (PRENATAL VITAMINS) 28-0.8 MG TABS Take 1 tablet by mouth daily.  30 tablet  12     Review of Systems   Constitutional: Painful ctx, no other complaints  at this time.  Height 4\' 10"  (1.473 m), weight 75.751 kg (167 lb), last menstrual period 05/11/2013. General appearance: alert, cooperative, appears stated age and no distress Lungs: clear to auscultation bilaterally Heart: regular rate and rhythm Abdomen: soft, non-tender; bowel sounds normal Pelvic: adequate. Dilation: 1 Effacement (%): 30 Station: -3 Presentation: Vertex Exam by:: Lavontay Kirk, md  Extremities: Homans sign is negative, no sign of DVT DTR's 2+ Presentation: cephalic Fetal monitoringBaseline: 130s bpm, Variability: Good {> 6 bpm), Accelerations: Reactive and Decelerations: Absent Uterine activity  q2-615m Dilation: 1 Effacement (%): 30 Station: -3 Exam by:: Carmine Youngberg, md   Prenatal labs: ABO, Rh: O/Positive/-- (12/04 0000) Antibody: Negative (12/04 0000) Rubella:   RPR: Nonreactive (02/23 0000)  HBsAg: Negative (12/04 0000)  HIV: Non-reactive (12/04 0000)  GBS: Negative (02/23 0000)    Clinic  HRC  Dating EDC by LMP consistent with 18 week ultrasound  Genetic Screen  Quad: negative                  Anatomic US  Normal  GTT GDM  TDaP vaccine  received  Flu vaccine  received  GBS   Baby Food  breast  Contraception  Mirena  Circumcision  female  Pediatrician  Support Person  partner      Prenatal Transfer Tool  Maternal Diabetes: Yes:  Diabetes Type:  Diet controlled Genetic Screening: Normal Maternal Ultrasounds/Referrals: Normal Fetal Ultrasounds or other Referrals:  None Maternal Substance Abuse:  No Significant Maternal Medications:  None Significant Maternal Lab Results: Lab values include: Group B Strep negative     No results found for this or any previous visit (from the past 24 hour(s)).  Assessment: Victoria Richmond is a 28 y.o. G3P2002 at 6074w4d by L=18 here for evaluation for active labor. Cervical change from yesterday. No cervical change since being present, however, with no resolution of contractions will move to  c-section #Labor:C-section today 6h after last meal 2030. #Pain: Per anesthesia #FWB: Cat I  #ID:  Ancef 2g prior to surgery #MOF: Breast #MOC:IUD   Minta BalsamMichael R Coree Riester 01/29/2014, 6:05 PM   This procedure has been fully reviewed with the patient and written informed consent has been obtained.

## 2014-01-29 NOTE — Anesthesia Preprocedure Evaluation (Addendum)
Anesthesia Evaluation  Patient identified by MRN, date of birth, ID band Patient awake    Reviewed: Allergy & Precautions, H&P , NPO status , Patient's Chart, lab work & pertinent test results  Airway Mallampati: II TM Distance: >3 FB Neck ROM: Full    Dental no notable dental hx.    Pulmonary neg pulmonary ROS,  breath sounds clear to auscultation  Pulmonary exam normal       Cardiovascular negative cardio ROS  Rhythm:Regular Rate:Normal     Neuro/Psych negative neurological ROS  negative psych ROS   GI/Hepatic negative GI ROS, Neg liver ROS,   Endo/Other  negative endocrine ROSdiabetes, Gestational  Renal/GU negative Renal ROS  negative genitourinary   Musculoskeletal negative musculoskeletal ROS (+)   Abdominal   Peds negative pediatric ROS (+)  Hematology negative hematology ROS (+)   Anesthesia Other Findings   Reproductive/Obstetrics negative OB ROS                          Anesthesia Physical Anesthesia Plan  ASA: II  Anesthesia Plan: Spinal   Post-op Pain Management:    Induction:   Airway Management Planned: Natural Airway  Additional Equipment:   Intra-op Plan:   Post-operative Plan:   Informed Consent: I have reviewed the patients History and Physical, chart, labs and discussed the procedure including the risks, benefits and alternatives for the proposed anesthesia with the patient or authorized representative who has indicated his/her understanding and acceptance.   Dental advisory given  Plan Discussed with: CRNA  Anesthesia Plan Comments:         Anesthesia Quick Evaluation

## 2014-01-29 NOTE — MAU Note (Signed)
Per HMitchell RN charge, pt to go to room 171 for eval

## 2014-01-29 NOTE — MAU Note (Signed)
Pt here for ctx's q5 minutes apart, denies bleeding or lof. For repeat c/s

## 2014-01-30 ENCOUNTER — Encounter (HOSPITAL_COMMUNITY): Payer: Self-pay | Admitting: *Deleted

## 2014-01-30 DIAGNOSIS — O34219 Maternal care for unspecified type scar from previous cesarean delivery: Secondary | ICD-10-CM

## 2014-01-30 DIAGNOSIS — N9989 Other postprocedural complications and disorders of genitourinary system: Secondary | ICD-10-CM | POA: Diagnosis not present

## 2014-01-30 DIAGNOSIS — O99814 Abnormal glucose complicating childbirth: Secondary | ICD-10-CM

## 2014-01-30 LAB — CBC
HEMATOCRIT: 34 % — AB (ref 36.0–46.0)
Hemoglobin: 11.4 g/dL — ABNORMAL LOW (ref 12.0–15.0)
MCH: 29.9 pg (ref 26.0–34.0)
MCHC: 33.5 g/dL (ref 30.0–36.0)
MCV: 89.2 fL (ref 78.0–100.0)
PLATELETS: 134 10*3/uL — AB (ref 150–400)
RBC: 3.81 MIL/uL — ABNORMAL LOW (ref 3.87–5.11)
RDW: 14 % (ref 11.5–15.5)
WBC: 8.7 10*3/uL (ref 4.0–10.5)

## 2014-01-30 LAB — GLUCOSE, CAPILLARY
GLUCOSE-CAPILLARY: 61 mg/dL — AB (ref 70–99)
Glucose-Capillary: 106 mg/dL — ABNORMAL HIGH (ref 70–99)

## 2014-01-30 LAB — RPR

## 2014-01-30 MED ORDER — FENTANYL CITRATE 0.05 MG/ML IJ SOLN: 25.0000 ug | INTRAMUSCULAR | Status: DC | PRN

## 2014-01-30 MED ORDER — MEPERIDINE HCL 25 MG/ML IJ SOLN
INTRAMUSCULAR | Status: AC
  Filled 2014-01-30: qty 1

## 2014-01-30 MED ORDER — MENTHOL 3 MG MT LOZG: 1.0000 | LOZENGE | OROMUCOSAL | Status: DC | PRN

## 2014-01-30 MED ORDER — MEASLES, MUMPS & RUBELLA VAC ~~LOC~~ INJ
0.5000 mL | INJECTION | Freq: Once | SUBCUTANEOUS | Status: DC
  Filled 2014-01-30: qty 0.5

## 2014-01-30 MED ORDER — DIBUCAINE 1 % RE OINT: 1.0000 "application " | TOPICAL_OINTMENT | RECTAL | Status: DC | PRN

## 2014-01-30 MED ORDER — ZOLPIDEM TARTRATE 5 MG PO TABS: 5.0000 mg | ORAL_TABLET | Freq: Every evening | ORAL | Status: DC | PRN

## 2014-01-30 MED ORDER — KETOROLAC TROMETHAMINE 60 MG/2ML IM SOLN
60.0000 mg | Freq: Once | INTRAMUSCULAR | Status: AC | PRN
  Administered 2014-01-30: 60 mg via INTRAMUSCULAR

## 2014-01-30 MED ORDER — MEPERIDINE HCL 25 MG/ML IJ SOLN
6.2500 mg | INTRAMUSCULAR | Status: AC | PRN
  Administered 2014-01-30 (×2): 6.25 mg via INTRAVENOUS

## 2014-01-30 MED ORDER — PRENATAL MULTIVITAMIN CH
1.0000 | ORAL_TABLET | Freq: Every day | ORAL | Status: DC
  Administered 2014-01-30 – 2014-02-01 (×3): 1 via ORAL
  Filled 2014-01-30 (×3): qty 1

## 2014-01-30 MED ORDER — DIPHENHYDRAMINE HCL 50 MG/ML IJ SOLN
INTRAMUSCULAR | Status: AC
  Filled 2014-01-30: qty 1

## 2014-01-30 MED ORDER — ONDANSETRON HCL 4 MG PO TABS: 4.0000 mg | ORAL_TABLET | ORAL | Status: DC | PRN

## 2014-01-30 MED ORDER — DIPHENHYDRAMINE HCL 50 MG/ML IJ SOLN: 25.0000 mg | INTRAMUSCULAR | Status: DC | PRN

## 2014-01-30 MED ORDER — IBUPROFEN 600 MG PO TABS
600.0000 mg | ORAL_TABLET | Freq: Four times a day (QID) | ORAL | Status: DC
  Administered 2014-01-30 – 2014-02-01 (×10): 600 mg via ORAL
  Filled 2014-01-30 (×10): qty 1

## 2014-01-30 MED ORDER — DEXTROSE 5 % IV SOLN
1.0000 ug/kg/h | INTRAVENOUS | Status: DC | PRN
  Filled 2014-01-30: qty 2

## 2014-01-30 MED ORDER — BUPIVACAINE HCL (PF) 0.5 % IJ SOLN
INTRAMUSCULAR | Status: DC | PRN
  Administered 2014-01-30: 30 mL

## 2014-01-30 MED ORDER — NALBUPHINE HCL 10 MG/ML IJ SOLN
5.0000 mg | INTRAMUSCULAR | Status: DC | PRN
  Filled 2014-01-30: qty 1

## 2014-01-30 MED ORDER — OXYCODONE-ACETAMINOPHEN 5-325 MG PO TABS
1.0000 | ORAL_TABLET | ORAL | Status: DC | PRN
  Administered 2014-01-30: 1 via ORAL
  Administered 2014-01-31: 2 via ORAL
  Administered 2014-01-31 (×2): 1 via ORAL
  Administered 2014-01-31: 2 via ORAL
  Administered 2014-02-01: 1 via ORAL
  Filled 2014-01-30: qty 1
  Filled 2014-01-30: qty 2
  Filled 2014-01-30 (×2): qty 1
  Filled 2014-01-30: qty 2
  Filled 2014-01-30: qty 1

## 2014-01-30 MED ORDER — DIPHENHYDRAMINE HCL 25 MG PO CAPS
25.0000 mg | ORAL_CAPSULE | ORAL | Status: DC | PRN
  Filled 2014-01-30: qty 1

## 2014-01-30 MED ORDER — MAGNESIUM HYDROXIDE 400 MG/5ML PO SUSP: 30.0000 mL | ORAL | Status: DC | PRN

## 2014-01-30 MED ORDER — NALOXONE HCL 0.4 MG/ML IJ SOLN: 0.4000 mg | INTRAMUSCULAR | Status: DC | PRN

## 2014-01-30 MED ORDER — FENTANYL CITRATE 0.05 MG/ML IJ SOLN
INTRAMUSCULAR | Status: DC | PRN
  Administered 2014-01-30: 87.5 ug via INTRAVENOUS

## 2014-01-30 MED ORDER — SCOPOLAMINE 1 MG/3DAYS TD PT72
MEDICATED_PATCH | TRANSDERMAL | Status: AC
  Filled 2014-01-30: qty 1

## 2014-01-30 MED ORDER — ONDANSETRON HCL 4 MG/2ML IJ SOLN: 4.0000 mg | Freq: Three times a day (TID) | INTRAMUSCULAR | Status: DC | PRN

## 2014-01-30 MED ORDER — LANOLIN HYDROUS EX OINT: 1.0000 "application " | TOPICAL_OINTMENT | CUTANEOUS | Status: DC | PRN

## 2014-01-30 MED ORDER — SODIUM CHLORIDE 0.9 % IJ SOLN: 3.0000 mL | INTRAMUSCULAR | Status: DC | PRN

## 2014-01-30 MED ORDER — MORPHINE SULFATE (PF) 0.5 MG/ML IJ SOLN
INTRAMUSCULAR | Status: DC | PRN
  Administered 2014-01-30: 4.9 mg via INTRAVENOUS

## 2014-01-30 MED ORDER — SCOPOLAMINE 1 MG/3DAYS TD PT72
1.0000 | MEDICATED_PATCH | Freq: Once | TRANSDERMAL | Status: DC
  Administered 2014-01-30: 1.5 mg via TRANSDERMAL

## 2014-01-30 MED ORDER — DIPHENHYDRAMINE HCL 25 MG PO CAPS: 25.0000 mg | ORAL_CAPSULE | Freq: Four times a day (QID) | ORAL | Status: DC | PRN

## 2014-01-30 MED ORDER — NALBUPHINE HCL 10 MG/ML IJ SOLN
5.0000 mg | INTRAMUSCULAR | Status: DC | PRN
  Administered 2014-01-30: 10 mg via INTRAVENOUS

## 2014-01-30 MED ORDER — METOCLOPRAMIDE HCL 5 MG/ML IJ SOLN: 10.0000 mg | Freq: Three times a day (TID) | INTRAMUSCULAR | Status: DC | PRN

## 2014-01-30 MED ORDER — SIMETHICONE 80 MG PO CHEW
80.0000 mg | CHEWABLE_TABLET | ORAL | Status: DC | PRN
  Administered 2014-01-31: 80 mg via ORAL

## 2014-01-30 MED ORDER — WITCH HAZEL-GLYCERIN EX PADS: 1.0000 "application " | MEDICATED_PAD | CUTANEOUS | Status: DC | PRN

## 2014-01-30 MED ORDER — ONDANSETRON HCL 4 MG/2ML IJ SOLN: 4.0000 mg | INTRAMUSCULAR | Status: DC | PRN

## 2014-01-30 MED ORDER — SIMETHICONE 80 MG PO CHEW
80.0000 mg | CHEWABLE_TABLET | ORAL | Status: DC
  Administered 2014-01-31: 80 mg via ORAL
  Filled 2014-01-30 (×2): qty 1

## 2014-01-30 MED ORDER — KETOROLAC TROMETHAMINE 30 MG/ML IJ SOLN: 30.0000 mg | Freq: Four times a day (QID) | INTRAMUSCULAR | Status: AC | PRN

## 2014-01-30 MED ORDER — TETANUS-DIPHTH-ACELL PERTUSSIS 5-2.5-18.5 LF-MCG/0.5 IM SUSP: 0.5000 mL | Freq: Once | INTRAMUSCULAR | Status: DC

## 2014-01-30 MED ORDER — OXYTOCIN 40 UNITS IN LACTATED RINGERS INFUSION - SIMPLE MED: 62.5000 mL/h | INTRAVENOUS | Status: AC

## 2014-01-30 MED ORDER — LACTATED RINGERS IV SOLN: INTRAVENOUS | Status: DC

## 2014-01-30 MED ORDER — KETOROLAC TROMETHAMINE 60 MG/2ML IM SOLN
INTRAMUSCULAR | Status: AC
  Filled 2014-01-30: qty 2

## 2014-01-30 MED ORDER — DIPHENHYDRAMINE HCL 50 MG/ML IJ SOLN
12.5000 mg | INTRAMUSCULAR | Status: DC | PRN
  Administered 2014-01-30: 12.5 mg via INTRAVENOUS

## 2014-01-30 MED ORDER — SENNOSIDES-DOCUSATE SODIUM 8.6-50 MG PO TABS
2.0000 | ORAL_TABLET | ORAL | Status: DC
  Administered 2014-01-31 (×2): 2 via ORAL
  Filled 2014-01-30 (×2): qty 2

## 2014-01-30 NOTE — Progress Notes (Signed)
Subjective: Postpartum Day #0: Cesarean Delivery complicated by bladder laceration Patient reports adequate pain control.  Has not yet had PO or ambulated. Good urine output from Foley. + flatus. Denies chest pain, dyspnea and fever/chills.  Objective: Vital signs in last 24 hours: Temp:  [97 F (36.1 C)-98.2 F (36.8 C)] 97.3 F (36.3 C) (04/26 0708) Pulse Rate:  [58-91] 69 (04/26 0708) Resp:  [11-37] 16 (04/26 0708) BP: (95-135)/(58-92) 119/67 mmHg (04/26 0708) SpO2:  [93 %-100 %] 97 % (04/26 0708) Weight:  [75.751 kg (167 lb)] 75.751 kg (167 lb) (04/25 1747)  Physical Exam:  General: alert and no distress CV: RRR, No MRG Lungs: CTA BL Ab: + BS, appropriately tender Incision: Covered in clean, dry dressing Lochia: appropriate Uterine Fundus: firm DVT Evaluation: No evidence of DVT seen on physical exam. Negative Homan's sign, No pedal edema   Recent Labs  01/29/14 1751 01/30/14 0545  HGB 13.1 11.4*  HCT 38.6 34.0*    Assessment/Plan: 28yo G3 now P3003 @ 618w4d Status post RLTCS complicated by a bladder laceration at 23:30 last evening. POD#0 Doing well postoperatively.  Will need Foley Urine Catheter for 7 days total, today is Day#1 Breastfeeding challenging thus far, continue to work with lactation Continue current care   Victoria JacobsonRobyn H Rabecca Richmond 01/30/2014, 8:08 AM

## 2014-01-30 NOTE — Anesthesia Postprocedure Evaluation (Signed)
  Anesthesia Post-op Note  Patient: Victoria Richmond  Procedure(s) Performed: Procedure(s): CESAREAN SECTION (N/A) Repair of Cystotomy (N/A)  Patient Location: Mother/Baby  Anesthesia Type:Spinal  Level of Consciousness: awake, alert  and oriented  Airway and Oxygen Therapy: Patient Spontanous Breathing  Post-op Pain: none  Post-op Assessment: Post-op Vital signs reviewed, Patient's Cardiovascular Status Stable, Respiratory Function Stable, No headache, No backache, No residual numbness and No residual motor weakness  Post-op Vital Signs: Reviewed and stable  Last Vitals:  Filed Vitals:   01/30/14 0708  BP: 119/67  Pulse: 69  Temp: 36.3 C  Resp: 16    Complications: No apparent anesthesia complications

## 2014-01-30 NOTE — Addendum Note (Signed)
Addendum created 01/30/14 0919 by Shanon PayorSuzanne M Joley Utecht, CRNA   Modules edited: Notes Section   Notes Section:  File: [id number]

## 2014-01-30 NOTE — Op Note (Addendum)
Hoang Campos-Godinez PROCEDURE DATE: 01/30/2014  PREOPERATIVE DIAGNOSES: Intrauterine pregnancy at  5360w5d weeks gestation; previous cesarean section x 2; labor  POSTOPERATIVE DIAGNOSES: The same, severe adhesive disease, complication of a bladder laceration  PROCEDURE: Repeat Low Transverse Cesarean Section, Bladder Laceration Repair  SURGEON:  Dr. Jaynie CollinsUgonna Karmello Abercrombie  ASSISTANT:  Dr. Suszanne ConnersMichael R. Odom   INDICATIONS: Erma HeritageYanelis Campos-Godinez is a 28 y.o. G3P2002 at 560w5d here for repeat cesarean section secondary to the indications listed under preoperative diagnoses; please see preoperative note for further details.  The risks of cesarean section were discussed with the patient including but were not limited to: bleeding which may require transfusion or reoperation; infection which may require antibiotics; injury to bowel, bladder, ureters or other surrounding organs; injury to the fetus; need for additional procedures including hysterectomy in the event of a life-threatening hemorrhage; placental abnormalities wth subsequent pregnancies, incisional problems, thromboembolic phenomenon and other postoperative/anesthesia complications.   The patient concurred with the proposed plan, giving informed written consent for the procedure.    FINDINGS:  Viable female infant in cephalic presentation.  Apgars 9 and 9.  Clear amniotic fluid.  Intact placenta, three vessel cord.  Severe suprafascial and intraperitoneal adhesive disease; bladder was tightly adherent to lower uterine segment.  3 cm right bladder dome laceration was inadvertently made while trying to access the lower uterine segment.  This laceration was repaired; water tightness was confirmed upon backfill with sterile milk solution.  Patient will keep the foley in place until 02/07/14.  Unable to access superior part of the uterus secondary to dense adhesions, adnexae were not encountered/evaluated. Patient will definitely need vertical incisions on skin  and uterus for future cesarean deliveries.   ANESTHESIA: Spinal INTRAVENOUS FLUIDS: 3500 ml ESTIMATED BLOOD LOSS: 700 ml URINE OUTPUT:  250 ml SPECIMENS: Placenta sent to L&D COMPLICATIONS: Bladder laceration at the dome of the bladder which was repaired and was found to be water-tight at the end of the repair  PROCEDURE IN DETAIL:  The patient preoperatively received intravenous antibiotics and had sequential compression devices applied to her lower extremities.  She was then taken to the operating room where spinal anesthesia was administered and was found to be adequate. She was then placed in a dorsal supine position with a leftward tilt, and prepped and draped in a sterile manner.  A foley catheter was placed into her bladder and attached to constant gravity.  After an adequate timeout was performed, a Pfannenstiel skin incision was made with scalpel and carried through to the underlying layer of fascia. The fascia was incised in the midline, and this incision was extended bilaterally using the Mayo scissors and electrocautery.  Significant adhesive disease was encountered.  A modified Mallard incision was made going about 2 cm horizontally into bilateral rectus muscles, the epigastric vessels were not encountered.  It was difficult to demarcate a plane for peritoneal entry, but a sharp incision was made into the most superior part of the exposed peritoneum.  Upon entry into the peritoneum, there was significant dense adhesions noted, the bladder was tightly adherent to the lower uterine segment. An attempt was made to get into an adequate plane of dissection but this was difficult and an inadvertent cystotomy was created on the right side of the bladder dome, about 3 cm in length.  Once the laceration was made, it helped delineate the plane more adequately, and a part of the bladder was able to be separated from the lower uterine segment in the middle and to  the left.  The right side of the lower  uterine segment was still tightly adherent to the bladder, and this adhesion extended superiorly to the midline. The decision was made to proceed with a transverse hysterotomy starting in the middle of the lower uterine segment extending to the right side and upwards, almost in a 'J' configuration.  After the membranes were ruptured for a copious amount of clear fluid, an attempt was made to deliver the fetal head and this was difficult. Vacuum assistance was used to deliver the fetal head; it was on for about three seconds.  After some difficulty, the infant was successfully delivered, the cord was clamped and cut and the infant was handed over to awaiting neonatology team. Uterine massage was then administered, and the placenta delivered intact with a three-vessel cord. The uterus was then cleared of clot and debris.  The hysterotomy was noted to have a midline extension.  The hysterotomy and extension was closed with 0 Vicryl in a running locked fashion, and an imbricating layer was also placed with 0 Vicryl. The pelvis was cleared of all clot and debris. Hemostasis was confirmed on all surfaces.  Attention was then turned to the cystotomy, and this was repaired using 2-0 Vicryl in a running, interlocking layer and a second imbricating layer.  Sterile milk solution was used to backfill the bladder, and this demonstrated that it was water-tight.  The foley will remain in place for 8 days, plan will be for removal on 02/07/14.   The fascia was then closed using 0 PDS in a running fashion.  The subcutaneous layer was irrigated and 30 ml of 0.5% Marcaine was injected subcutaneously around the incision.  The skin was closed with a 4-0 Vicryl subcuticular stitch. The patient tolerated the procedure well. Sponge, lap, instrument and needle counts were correct x 2.  She was taken to the recovery room in stable condition.   Of note, patient and her husband were informed of the complication of the cystotomy using the help  of a Spanish interpreter. It was also emphasized that her foley will remain in place until 02/07/14 to help with healing of the cystotomy.    Jaynie CollinsUGONNA  Chizaram Latino, MD, FACOG Attending Obstetrician & Gynecologist Faculty Practice, [facility]

## 2014-01-30 NOTE — Anesthesia Postprocedure Evaluation (Signed)
  Anesthesia Post-op Note  Patient: Victoria Richmond  Procedure(s) Performed: Procedure(s) (LRB): CESAREAN SECTION (N/A) Repair of Cystotomy (N/A)  Patient Location: PACU  Anesthesia Type: Spinal  Level of Consciousness: awake and alert   Airway and Oxygen Therapy: Patient Spontanous Breathing  Post-op Pain: mild  Post-op Assessment: Post-op Vital signs reviewed, Patient's Cardiovascular Status Stable, Respiratory Function Stable, Patent Airway and No signs of Nausea or vomiting  Last Vitals:  Filed Vitals:   01/29/14 2239  BP: 120/62  Pulse: 68  Temp:   Resp:     Post-op Vital Signs: stable   Complications: No apparent anesthesia complications

## 2014-01-30 NOTE — Transfer of Care (Signed)
Immediate Anesthesia Transfer of Care Note  Patient: Victoria Richmond  Procedure(s) Performed: Procedure(s): CESAREAN SECTION (N/A) Repair of Cystotomy (N/A)  Patient Location: PACU  Anesthesia Type:Spinal  Level of Consciousness: awake  Airway & Oxygen Therapy: Patient Spontanous Breathing  Post-op Assessment: Report given to PACU RN and Post -op Vital signs reviewed and stable  Post vital signs: stable  Complications: No apparent anesthesia complications

## 2014-01-30 NOTE — Lactation Note (Addendum)
This note was copied from the chart of Victoria Jolly Richmond. Lactation Consultation Note  Patient Name: Victoria Richmond RUEAV'WToday's Date: 01/30/2014 Reason for consult: Initial assessment Initial assessment, baby 21 hours of life. With in-house interpreter Trinna PostAlex, discussed with mom how BF is going. Mom states that baby latched well last 2 feedings. Enc mom to call out for assistance as needed. Showed mom how to hand express, and mom is dripping with colostrum. Mom's nipples are everted without stimulation. Enc mom to offer the drops first so baby will open mouth wide to get a deep latch. Enc mom to offer STS and feed with cues. Baby is large, but dates seem to be late pre-term, so discussed with mom to make sure she continues to hear swallows with BF. Mom states she is hearing lots of swallows.   Maternal Data Infant to breast within first hour of birth: Yes Does the patient have breastfeeding experience prior to this delivery?: Yes  Feeding Feeding Type:  (Baby sleeping, just nursed recently, mom asked to call out for assistance.)  [facility]                      Lactation Tools Discussed/Used     Consult Status Consult Status: Follow-up Follow-up type: In-patient    Sherlyn HayJennifer D Errika Narvaiz 01/30/2014, 8:41 PM

## 2014-01-31 ENCOUNTER — Encounter: Payer: Self-pay | Admitting: Obstetrics & Gynecology

## 2014-01-31 ENCOUNTER — Encounter: Payer: No Typology Code available for payment source | Admitting: Obstetrics & Gynecology

## 2014-01-31 ENCOUNTER — Encounter (HOSPITAL_COMMUNITY): Payer: Self-pay | Admitting: Obstetrics & Gynecology

## 2014-01-31 NOTE — Progress Notes (Signed)
Subjective: Postpartum Day #1: repeat Cesarean (#3) Delivery complicated by bladder laceration  Patient resting in bed, Foley cath and SCDs in place. Reports pain is controlled, only experiences some incisional pain when stands up. Stable vaginal bleeding. Tolerating ambulation, PO intake. Normal appearing urine in foley, without hematuria. Denies flatus or BM. Eager to ambulate more today.  Denies fever/chills, dizziness/lightheadedness, edema, HA.  Objective: Vital signs in last 24 hours: Temp:  [98.4 F (36.9 C)-98.7 F (37.1 C)] 98.6 F (37 C) (04/27 0605) Pulse Rate:  [69-84] 80 (04/27 0605) Resp:  [18] 18 (04/27 0605) BP: (90-98)/(60-66) 93/60 mmHg (04/27 0605) SpO2:  [95 %-98 %] 95 % (04/27 0017)  Physical Exam:  General: well-appearing, NAD CV: RRR, no murmurs Lungs: CTAB Abdomen: soft, no significant tenderness, + active BS Incision: Covered in clean, dry dressing, mild TTP Lochia: appropriate Uterine Fundus: firm, U-1 DVT Evaluation: No evidence of DVT seen on physical exam. Negative Homan's sign, No pedal edema   Recent Labs  01/29/14 1751 01/30/14 0545  HGB 13.1 11.4*  HCT 38.6 34.0*    Assessment/Plan: 28yo G3 now P3003 @ 1547w4d Status post rLTCS (#3) complicated by a bladder laceration, s/p repair.  POD#1 Continues to do well postoperatively. - Continue pain control: Motrin, Percocet - Hgb stable 13 to 11.4 - Foley in place x 7 days (s/p bladder repair), Day 2/7, last day 02/05/14. Good UOP, clear urine no hematuria - Continue breastfeeding, Lactation consult - Contraception: Paraguard IUD - Expect to be discharged 1-2 days, >48 hours  Saralyn PilarAlexander Lindy Garczynski, DO [facility], PGY-1 01/31/2014, 7:41 AM

## 2014-01-31 NOTE — Lactation Note (Signed)
This note was copied from the chart of Victoria Richmond. Lactation Consultation Note Follow up visit at 46 hours of age.  Mom reports no milk and baby wants more after feedings so she is giving some formula.  Interpreter present.  Mom has bruised nipples bilaterally.  Encouraged ebm to nipples.  Mom denies concerns with interpreter present and then interpreter called to delivery.  Assisted with latch baby STS,  mom complains of pain of 6-7 and is noticeably in pain.  Baby has wide flanged lips and appears to have a good deep latch.  After several minutes, I removed baby and nipple was round indicating good latch.  Assisted with relatching after repositioning and baby does well again with wide flanged lips, mom appears to be in less pain.  Encouraged pillow support and stimulation to keep baby suckling.  Baby continues  At the breast.  Encouraged to call for assist as needed.  Report given to [facility] and asked to give comfort gels when she can explain with interpreter.     Patient Name: Victoria Richmond ZOXWR'UToday's Date: 01/31/2014 Reason for consult: Follow-up assessment   Maternal Data    Feeding Feeding Type: Breast Fed Length of feed: 10 min  LATCH Score/Interventions Latch: Repeated attempts needed to sustain latch, nipple held in mouth throughout feeding, stimulation needed to elicit sucking reflex. Intervention(s): Breast compression;Breast massage;Assist with latch  Audible Swallowing: A few with stimulation Intervention(s): Hand expression;Skin to skin  Type of Nipple: Everted at rest and after stimulation  Comfort (Breast/Nipple): Filling, red/small blisters or bruises, mild/mod discomfort  Problem noted: Cracked, bleeding, blisters, bruises;Mild/Moderate discomfort  Hold (Positioning): Assistance needed to correctly position infant at breast and maintain latch. Intervention(s): Breastfeeding basics reviewed;Support Pillows;Position options;Skin to skin  LATCH  Score: 6  Lactation Tools Discussed/Used Tools: Comfort gels   Consult Status Consult Status: Follow-up Date: 02/01/14 Follow-up type: In-patient    Arvella MerlesJana Lynn Krayton Wortley 01/31/2014, 9:56 PM

## 2014-01-31 NOTE — Progress Notes (Signed)
Ur chart review completed.  

## 2014-01-31 NOTE — Progress Notes (Signed)
I examined pt and agree with documentation above and resident plan of care.  Informed the nurse to place urine leg catheter. Eino FarberWalidah Paul HalfN Muhammad, CNM

## 2014-02-01 LAB — GLUCOSE, CAPILLARY: Glucose-Capillary: 93 mg/dL (ref 70–99)

## 2014-02-01 MED ORDER — IBUPROFEN 600 MG PO TABS: 600.0000 mg | ORAL_TABLET | Freq: Four times a day (QID) | ORAL | Status: DC

## 2014-02-01 MED ORDER — OXYCODONE-ACETAMINOPHEN 5-325 MG PO TABS: 1.0000 | ORAL_TABLET | ORAL | Status: DC | PRN

## 2014-02-01 MED ORDER — SENNOSIDES-DOCUSATE SODIUM 8.6-50 MG PO TABS: 2.0000 | ORAL_TABLET | Freq: Every evening | ORAL | Status: DC | PRN

## 2014-02-01 MED ORDER — SIMETHICONE 80 MG PO CHEW: 80.0000 mg | CHEWABLE_TABLET | ORAL | Status: DC | PRN

## 2014-02-01 NOTE — Discharge Summary (Signed)
Attestation of Attending Supervision of Resident: Evaluation and management procedures were performed by the Family Medicine Resident under my supervision.  I have seen and examined the patient, reviewed the resident's note and chart, and I agree with the management and plan.  Ahmaad Neidhardt, MD, FACOG Attending Obstetrician & Gynecologist Faculty Practice, [facility] 

## 2014-02-01 NOTE — Discharge Instructions (Signed)
Cuidados en el postparto luego de un parto por cesárea  °(Postpartum Care After Cesarean Delivery) °Después del parto (período de postparto), la estadía normal en el hospital es de 24-72 horas. Si hubo problemas con el trabajo de parto o el parto, o si tiene otros problemas médicos, es posible que deba permanecer en el hospital por más tiempo.  °Mientras esté en el hospital, recibirá ayuda e instrucciones sobre cómo cuidar de usted misma y de su bebé recién nacido durante el postparto.  °Mientras esté en el hospital:  °· Es normal que sienta dolor o molestias en la incisión en el abdomen. Asegúrese de decirle a las enfermeras si siente dolor, así como donde siente el dolor y qué empeora el dolor. °· Si está amamantando, puede sentir contracciones dolorosas en el útero durante algunas semanas. Esto es normal. Las contracciones ayudan a que el útero vuelva a su tamaño normal. °· Es normal tener algo de sangrado después del parto. °· Durante los primeros 1-3 días después del parto, el flujo es de color rojo y la cantidad puede ser similar a un período. °· Es frecuente que el flujo se inicie y se detenga. °· En los primeros días, puede eliminar algunos coágulos pequeños. Informe a las enfermeras si comienza a eliminar coágulos grandes o aumenta el flujo. °· No  elimine los coágulos de sangre por el inodoro antes de que la enfermera los vea. °· Durante los próximos 3 a 10 días después del parto, el flujo debe ser más acuoso y rosado o marrón. °· De diez a catorce días después del parto, el flujo debe ser una pequeña cantidad de secreción de color blanco amarillento. °· La cantidad de flujo disminuirá en las primeras semanas después del parto. El flujo puede detenerse en 6-8 semanas. La mayoría de las mujeres no tienen más flujo a las 12 semanas después del parto. °· Usted debe cambiar sus apósitos con frecuencia. °· Lávese bien las manos con agua y jabón durante al menos 20 segundos después de cambiar el apósito, usar el  baño o antes de sostener o alimentar a su recién nacido. °· Se le quitará la vía intravenosa (IV) cuando ya esté bebiendo suficientes líquidos. °· El tubo de drenaje para la orina (catéter urinario) que se inserta antes del parto puede ser retirado luego de 6-8 horas después del parto o cuando las piernas vuelvan a tener sensibilidad. Usted puede sentir que tiene que vaciar la vejiga durante las primeras 6-8 horas después de que le quiten el catéter. °· Si se siente débil, mareada o se desmaya, llame a su enfermera antes de levantarse de la cama por primera vez y antes de tomar una ducha por primera vez. °· En los primeros días después del parto, podrá sentir las mamas sensibles y llenas. Esto se llama congestión. La sensibilidad en los senos por lo general desaparece dentro de las 48-72 horas después de que ocurre la congestión. También puede notar que la leche se escapa de sus senos. Si no está amamantando no estimule sus pechos. La estimulación de las mamas hace que sus senos produzcan más leche. °· Pasar tanto tiempo como sea posible con el bebé recién nacido es muy importante. Durante este tiempo, usted y su bebé deben sentirse cerca y conocerse uno al otro. Tener al bebé en su habitación (alojamiento conjunto) ayudará a fortalecer el vínculo con el bebé recién nacido. Esto le dará tiempo para conocerlo y atenderlo de manera cómoda. °· Las hormonas se modifican después del parto. A   veces, los cambios hormonales pueden causar tristeza o ganas de llorar por un tiempo. Estos sentimientos no deben durar más de unos pocos días. Si duran más que eso, debe hablar con su médico. °· Si lo desea, hable con su médico acerca de los métodos de planificación familiar o métodos anticonceptivos. °· Hable con su médico acerca de las vacunas. El médico puede indicarle que se aplique las siguientes vacunas antes de salir del hospital: °· Vacuna contra el tétanos, la difteria y la tos ferina (Tdap) o el tétanos y la difteria (Td).  Es muy importante que usted y su familia (incluyendo a los abuelos) u otras personas que cuidan al recién nacido estén al día con las vacunas Tdap o Td. Las vacunas Tdap o Td pueden ayudar a proteger al recién nacido de enfermedades. °· Inmunización contra la rubéola. °· Inmunización contra la varicela. °· Inmunización contra la gripe. Usted debe recibir esta vacunación anual si no la ha recibido durante el embarazo. °Document Released: 09/09/2012 °ExitCare® Patient Information ©2014 [employer]. ° °

## 2014-02-01 NOTE — Discharge Summary (Signed)
Physician Obstetric Discharge Summary  Patient ID: Victoria Richmond MRN: [id number] DOB/AGE: [birth year] 28 y.o.  Reason for Admission: onset of labor requiring repeat rLTCS (#3) to prevent labor progression Prenatal Procedures: NST Intrapartum Procedures: cesarean: low cervical, transverse  (repeat, #3) Postpartum Procedures: Continue Foley Cath on discharge (to be removed 02/04/14) Complications-Operative and Postpartum: Significant abdominal adhesions, dome of bladder laceration (repaired, water tight)  Operative Delivery Note PROCEDURE DATE: 01/30/2014  PREOPERATIVE DIAGNOSES: Intrauterine pregnancy at 6742w5d weeks gestation; previous cesarean section x 2; labor  POSTOPERATIVE DIAGNOSES: The same, severe adhesive disease, complication of a bladder laceration  PROCEDURE: Repeat Low Transverse Cesarean Section, Bladder Laceration Repair  SURGEON: Dr. Jaynie CollinsUgonna Anyanwu  ASSISTANT: Dr. Suszanne ConnersMichael R. Odom  Patient will definitely need vertical incisions on skin and uterus for future cesarean deliveries.  ANESTHESIA: Spinal  INTRAVENOUS FLUIDS: 3500 ml  ESTIMATED BLOOD LOSS: 700 ml  URINE OUTPUT: 250 ml  SPECIMENS: Placenta sent to L&D  COMPLICATIONS: Bladder laceration at the dome of the bladder which was repaired and was found to be water-tight at the end of the repair - "See op note dated 01/30/14 for details"  H/H:  Lab Results  Component Value Date/Time   HGB 11.4* 01/30/2014  5:45 AM   HGB 12.4 11/29/2013   HCT 34.0* 01/30/2014  5:45 AM   HCT 37 11/29/2013    Brief Hospital Course: Victoria Richmond is a [id number] who presented in SOL and to prevent labor progression, she underwent cesarean section on 01/29/2014 - 01/30/2014. For surgery details, please refer to the operative note. Of note, significant complication with significant prior abdominal adhesions and dome of bladder laceration, s/p repair with confirmed water tight following repair, required Foley Cath post-op to remain until  02/04/14. Patient had an uncomplicated postpartum course.  By time of discharge on POD/PPD#2, her pain was controlled on oral pain medications; she had appropriate lochia and was ambulating, voiding without difficulty, tolerating regular diet and passing flatus. Plans to continue breastfeeding, and interested in Paraguard for contraception. Scheduled for post-op follow-up at clinic on 02/04/14 to remove Foley Catheter, discharged with leg-bag and appropriate training. She was deemed stable for discharge to home.    Physical Exam:  General: alert and cooperative Lochia: appropriate Uterine Fundus: firm Incision: healing well, no significant drainage DVT Evaluation: No evidence of DVT seen on physical exam. Negative Homan's sign. No cords or calf tenderness. No significant calf/ankle edema.  Discharge Diagnoses: Delivery by rLTCS (#3), s/p bladder laceration repair  Discharge Information: Date: 02/01/2014 Activity: pelvic rest Diet: routine Baby feeding: plans to breastfeed Contraception: Paraguard IUD Medications: PNV, Ibuprofen, Senna-Docusate, and Percocet Discharged Condition: good Instructions: refer to practice specific booklet Discharge to: home  Saralyn PilarAlexander Lino Wickliff, DO [facility], PGY-1  02/01/2014, 8:00 AM

## 2014-02-04 ENCOUNTER — Ambulatory Visit: Payer: No Typology Code available for payment source

## 2014-02-07 ENCOUNTER — Ambulatory Visit (INDEPENDENT_AMBULATORY_CARE_PROVIDER_SITE_OTHER): Payer: No Typology Code available for payment source | Admitting: *Deleted

## 2014-02-07 DIAGNOSIS — O99810 Abnormal glucose complicating pregnancy: Secondary | ICD-10-CM

## 2014-02-07 DIAGNOSIS — O24419 Gestational diabetes mellitus in pregnancy, unspecified control: Secondary | ICD-10-CM

## 2014-02-07 NOTE — Progress Notes (Signed)
Pt voided 100 cc clear yellow urine, Bladder scan showed 64 cc residual urine, Abdominal dressing removed, steri strips with old blood noted, incision well approximated, no signs of infection.  Educated patient on sign/symptoms of infections and when to call physician.  Pt questions nipples with sores, instructed on proper breastfeeding techniques with visual aids.  Showed how to use breast milk as lotion or over the counter lanolin.  Pt questions return appointment for blood sugars/postpartum, information sent to front desk.  Pt verbalizes understanding through interpreter. Pt fasting today, will draw blood sugar today.

## 2014-02-07 NOTE — Progress Notes (Signed)
Foley catheter removed at 0820, urine clear in leg bag.  Instructions given.

## 2014-02-08 ENCOUNTER — Encounter: Payer: Self-pay | Admitting: Obstetrics & Gynecology

## 2014-02-08 ENCOUNTER — Other Ambulatory Visit: Payer: No Typology Code available for payment source

## 2014-02-08 ENCOUNTER — Inpatient Hospital Stay (HOSPITAL_COMMUNITY)
Admission: RE | Admit: 2014-02-08 | Payer: No Typology Code available for payment source | Source: Ambulatory Visit | Admitting: Obstetrics & Gynecology

## 2014-02-08 ENCOUNTER — Encounter (HOSPITAL_COMMUNITY): Admission: RE | Payer: Self-pay | Source: Ambulatory Visit

## 2014-02-08 LAB — GLUCOSE TOLERANCE, 2 HOURS
GLUCOSE, FASTING: 102 mg/dL — AB (ref 70–99)
Glucose, 2 hour: 199 mg/dL — ABNORMAL HIGH (ref 70–139)

## 2014-02-08 SURGERY — Surgical Case
Anesthesia: Regional

## 2014-02-24 ENCOUNTER — Ambulatory Visit (INDEPENDENT_AMBULATORY_CARE_PROVIDER_SITE_OTHER): Payer: No Typology Code available for payment source | Admitting: Obstetrics and Gynecology

## 2014-02-24 NOTE — Patient Instructions (Signed)
Levonorgestrel intrauterine device (IUD) What is this medicine? LEVONORGESTREL IUD (LEE voe nor jes trel) is a contraceptive (birth control) device. The device is placed inside the uterus by a healthcare professional. It is used to prevent pregnancy and can also be used to treat heavy bleeding that occurs during your period. Depending on the device, it can be used for 3 to 5 years. This medicine may be used for other purposes; ask your health care provider or pharmacist if you have questions. COMMON BRAND NAME(S): Mirena, Skyla What should I tell my health care provider before I take this medicine? They need to know if you have any of these conditions: -abnormal Pap smear -cancer of the breast, uterus, or cervix -diabetes -endometritis -genital or pelvic infection now or in the past -have more than one sexual partner or your partner has more than one partner -heart disease -history of an ectopic or tubal pregnancy -immune system problems -IUD in place -liver disease or tumor -problems with blood clots or take blood-thinners -use intravenous drugs -uterus of unusual shape -vaginal bleeding that has not been explained -an unusual or allergic reaction to levonorgestrel, other hormones, silicone, or polyethylene, medicines, foods, dyes, or preservatives -pregnant or trying to get pregnant -breast-feeding How should I use this medicine? This device is placed inside the uterus by a health care professional. Talk to your pediatrician regarding the use of this medicine in children. Special care may be needed. Overdosage: If you think you have taken too much of this medicine contact a poison control center or emergency room at once. NOTE: This medicine is only for you. Do not share this medicine with others. What if I miss a dose? This does not apply. What may interact with this medicine? Do not take this medicine with any of the following  medications: -amprenavir -bosentan -fosamprenavir This medicine may also interact with the following medications: -aprepitant -barbiturate medicines for inducing sleep or treating seizures -bexarotene -griseofulvin -medicines to treat seizures like carbamazepine, ethotoin, felbamate, oxcarbazepine, phenytoin, topiramate -modafinil -pioglitazone -rifabutin -rifampin -rifapentine -some medicines to treat HIV infection like atazanavir, indinavir, lopinavir, nelfinavir, tipranavir, ritonavir -St. John's wort -warfarin This list may not describe all possible interactions. Give your health care provider a list of all the medicines, herbs, non-prescription drugs, or dietary supplements you use. Also tell them if you smoke, drink alcohol, or use illegal drugs. Some items may interact with your medicine. What should I watch for while using this medicine? Visit your doctor or health care professional for regular check ups. See your doctor if you or your partner has sexual contact with others, becomes HIV positive, or gets a sexual transmitted disease. This product does not protect you against HIV infection (AIDS) or other sexually transmitted diseases. You can check the placement of the IUD yourself by reaching up to the top of your vagina with clean fingers to feel the threads. Do not pull on the threads. It is a good habit to check placement after each menstrual period. Call your doctor right away if you feel more of the IUD than just the threads or if you cannot feel the threads at all. The IUD may come out by itself. You may become pregnant if the device comes out. If you notice that the IUD has come out use a backup birth control method like condoms and call your health care provider. Using tampons will not change the position of the IUD and are okay to use during your period. What side effects may I   notice from receiving this medicine? Side effects that you should report to your doctor or  health care professional as soon as possible: -allergic reactions like skin rash, itching or hives, swelling of the face, lips, or tongue -fever, flu-like symptoms -genital sores -high blood pressure -no menstrual period for 6 weeks during use -pain, swelling, warmth in the leg -pelvic pain or tenderness -severe or sudden headache -signs of pregnancy -stomach cramping -sudden shortness of breath -trouble with balance, talking, or walking -unusual vaginal bleeding, discharge -yellowing of the eyes or skin Side effects that usually do not require medical attention (report to your doctor or health care professional if they continue or are bothersome): -acne -breast pain -change in sex drive or performance -changes in weight -cramping, dizziness, or faintness while the device is being inserted -headache -irregular menstrual bleeding within first 3 to 6 months of use -nausea This list may not describe all possible side effects. Call your doctor for medical advice about side effects. You may report side effects to FDA at 1-800-FDA-1088. Where should I keep my medicine? This does not apply. NOTE: This sheet is a summary. It may not cover all possible information. If you have questions about this medicine, talk to your doctor, pharmacist, or health care provider.  2014, Elsevier/Gold Standard. (2011-10-24 13:54:04) Cuidados en el postparto luego de un parto por cesrea  (Postpartum Care After Cesarean Delivery) Despus del parto (perodo de postparto), la estada normal en el hospital es de 24-72 horas. Si hubo problemas con el trabajo de parto o el parto, o si tiene otros problemas mdicos, es posible que Hydrologistdeba permanecer en el hospital por ms Colonatiempo.  Mientras est en el hospital, recibir [place] Grenadinesayuda e instrucciones sobre cmo cuidar de usted misma y de su beb recin nacido durante el postparto.  Mientras est en el hospital:   Es normal que sienta dolor o molestias en la incisin en el  abdomen. Asegrese de decirle a las enfermeras si Electronics engineersiente dolor, as como donde Medical laboratory scientific officersiente el dolor y Training and development officerqu empeora el dolor.  Si est amamantando, puede sentir contracciones dolorosas en el tero durante algunas semanas. Esto es normal. Las contracciones ayudan a que el tero vuelva a su tamao normal.  Es normal tener algo de sangrado despus del [place].  Durante los primeros 1-3 das despus del parto, el flujo es de color rojo y la cantidad puede ser similar a un perodo.  Es frecuente que el flujo se inicie y se Chief Strategy Officerdetenga.  En los primeros [place] Sandydas, puede eliminar algunos cogulos pequeos. Informe a las enfermeras si comienza a eliminar cogulos grandes o aumenta el flujo.  No  elimine los cogulos de sangre por el inodoro antes de que la Johnson Controlsenfermera los vea.  Durante los prximos 3 a [address] das despus del parto, el flujo debe ser ms acuoso y rosado o Child psychotherapistmarrn.  Jake Churche diez a catorce American International Groupdas despus del parto, el flujo debe ser una pequea cantidad de secrecin de color blanco amarillento.  La cantidad de flujo disminuir en las primeras semanas despus del parto. El flujo puede detenerse en 6-8 semanas. La mayora de las mujeres no tienen ms flujo a las 12 semanas despus del [place]arto.  Usted debe cambiar sus apsitos con frecuencia.  Lvese bien las manos con agua y jabn durante al menos 20 segundos despus de cambiar el apsito, usar el bao o antes de Occupational psychologistsostener o Corporate treasureralimentar a su recin nacido.  Se le quitar la va intravenosa (IV) cuando ya est bebiendo suficientes lquidos.  El tubo de drenaje para la orina (catter urinario) que se inserta antes del parto puede ser retirado Express Scriptsluego de 6-8 horas despus del parto o cuando las piernas vuelvan a Warehouse managertener sensibilidad. Usted puede sentir que tiene que vaciar la vejiga durante las primeras 6-8 horas despus de que le quiten el Licensed conveyancercatter.  Si se siente dbil, mareada o se desmaya, llame a su enfermera antes de levantarse de la cama por primera vez y antes de tomar  una ducha por primera vez.  En los primeros [address] despus del parto, podr sentir las mamas sensibles y Uniontownllenas. Esto se llama congestin. La sensibilidad en los senos por lo general desaparece dentro de las 48-72 horas despus de que ocurre la congestin. Tambin puede notar que la [place] se escapa de sus senos. Si no est amamantando no estimule sus pechos. La estimulacin de las mamas hace que sus senos produzcan ms [place].  Pasar tanto tiempo como sea posible con el beb recin nacido es muy importante. Durante este [place]iempo, usted y su beb deben sentirse cerca y conocerse uno al otro. Tener al beb en su habitacin (alojamiento conjunto) ayudar a fortalecer el vnculo con el beb recin nacido. Esto le dar tiempo para conocerlo y atenderlo de Germantownmanera cmoda.  Las hormonas se modifican despus del parto. A veces, los cambios hormonales pueden causar tristeza o ganas de llorar por un tiempo. Estos sentimientos no deben durar ms de Hughes Supplyunos pocos das. Si duran ms que eso, debe hablar con su mdico.  Si lo desea, hable con su mdico acerca de los mtodos de planificacin familiar o mtodos anticonceptivos.  Hable con su mdico acerca de las vacunas. El mdico puede indicarle que se aplique las siguientes vacunas antes de salir del hospital:  [place] PrincipeVacuna contra el ttanos, la difteria y la tos ferina (Tdap) o el ttanos y la difteria (Td). Es muy importante que usted y su familia (incluyendo a los abuelos) u otras personas que cuidan al recin nacido estn al da con las vacunas Tdap o Td. Las vacunas Tdap o Td pueden ayudar a proteger al recin nacido de enfermedades.  Inmunizacin contra la rubola.  Inmunizacin contra la varicela.  Inmunizacin contra la gripe. Usted debe recibir esta vacunacin anual si no la ha recibido Academic librariandurante el embarazo. Document Released: 09/09/2012 [facility] Patient Information 2014 [place], [place].

## 2014-02-24 NOTE — Progress Notes (Signed)
  Subjective:     Victoria Richmond is a 28 y.o. female who presents for a postpartum visit. She is 4 weeks postpartum following atertiary  low cervical transverse Cesarean section. She had a bladder laceration intraoperatively that was repaired. I have fully reviewed the prenatal and intrapartum course. The delivery was at 37.5 gestational weeks when she presented in labor. Outcome: repeat cesarean section, low transverse incision. and vertical extension. Cleaning incision with alcohol daily and still painful at site; using Tylenol. She had a superficial wound infection and took antibiotics at 1 wk postpartum when drain removed from wound.  Drain out at 1 wk and had a 2hr OGTT done then which was abnormal (done too early).  Baby's course has been uncomplicated. Baby is feeding by bottle and breast. Bleeding no bleeding. Bowel function is normal. Bladder function is normal. Had dysuria for first week, resolved. Patient is not sexually active. Contraception method is abstinence. Postpartum depression screening: negative.    Review of Systems Pertinent items are noted in HPI.   Objective:    BP 112/75  Pulse 78  Ht 4\' 10"  (1.473 m)  Wt 134 lb 8 oz (61.009 kg)  BMI 28.12 kg/m2  Breastfeeding? No  General:  alert, cooperative and no distress   Breasts:  inspection negative, no nipple discharge or bleeding, no masses or nodularity palpable  Lungs: clear to auscultation bilaterally  Heart:  regular rate and rhythm, S1, S2 normal, no murmur, click, rub or gallop  Abdomen: soft, non-tender; bowel sounds normal; no masses,  no organomegaly Minimal erythema mild ttp surrounded healed Pfannensteil incision and slight serous drainage and everted 1 cm upper skin edge at drain site> silver nitrate applied   Vulva:  not evaluated  Vagina: not evaluated     Corpus: involuted, NT  Adnexa:  not evaluated  Rectal Exam: Not performed.        Assessment:     4 wks postpartum exam. Pap smear not done  at today's visit.   Plan:  Consulted Dr. Marice Potterove who looked at incision and suggested silver nitrate application  1. [facility]: [facility]. Scheduled for another 2 hr OGTT at 7-8 wks postpartum 3. Follow up in: 4 weeks or as needed.  Rx ibuprofen 600mg  q6h

## 2014-03-15 ENCOUNTER — Other Ambulatory Visit (INDEPENDENT_AMBULATORY_CARE_PROVIDER_SITE_OTHER): Payer: No Typology Code available for payment source | Admitting: Obstetrics & Gynecology

## 2014-03-15 VITALS — BP 97/69 | HR 75 | Temp 97.3°F

## 2014-03-15 DIAGNOSIS — T8141XA Infection following a procedure, superficial incisional surgical site, initial encounter: Secondary | ICD-10-CM

## 2014-03-15 DIAGNOSIS — Z8632 Personal history of gestational diabetes: Secondary | ICD-10-CM

## 2014-03-15 LAB — GLUCOSE TOLERANCE, 2 HOURS
GLUCOSE, 2 HOUR: 173 mg/dL — AB (ref 70–139)
Glucose, Fasting: 101 mg/dL — ABNORMAL HIGH (ref 70–99)

## 2014-03-15 MED ORDER — IBUPROFEN 600 MG PO TABS: 600.0000 mg | ORAL_TABLET | Freq: Four times a day (QID) | ORAL | Status: DC

## 2014-03-15 MED ORDER — SULFAMETHOXAZOLE-TMP DS 800-160 MG PO TABS: 1.0000 | ORAL_TABLET | Freq: Two times a day (BID) | ORAL | Status: DC

## 2014-03-15 NOTE — Progress Notes (Signed)
Here for 2 hr GTT. Reported to interpreter her c/s wound closed , but then opened again, and has done that before.

## 2014-03-15 NOTE — Progress Notes (Signed)
   CLINIC ENCOUNTER NOTE  History:  28 y.o. [id number] here today for scheduled postpartum 2 hr GTT and incision check. Patient is Spanish-speaking only, Spanish interpreter present for this encounter. Had cesarean section on 01/30/14, reports that her incision "is not healing well and has opened in some places". She reports associated pain. No fevers on other symptoms.   The following portions of the patient's history were reviewed and updated as appropriate: allergies, current medications, past family history, past medical history, past social history, past surgical history and problem list.  Review of Systems:  Pertinent items are noted in HPI.  Objective:  Physical Exam BP 97/69  Pulse 75  Temp(Src) 97.3 F (36.3 C) Gen: NAD Abd: Soft, nontender and nondistended Incision: Superficial "ulcers" noted on left side on incision, above and below incision, corresponding to areas of subcuticular stitch placement.  A few areas noted on left.  Some blanching erythema noted on the left side with some induration.  Assessment & Plan:  Patient with likely suture reaction and superinfection Bactrim DS and Ibuprofen prescribed; proper wound hygiene emphasized Will follow up 2 hr GTT results and manage accordingly    Jaynie Collins, MD, FACOG Attending Obstetrician & Gynecologist Faculty Practice, [facility]

## 2014-03-16 ENCOUNTER — Telehealth: Payer: Self-pay

## 2014-03-16 NOTE — Telephone Encounter (Signed)
Message copied by Louanna Raw on Wed Mar 16, 2014 11:23 AM ------      Message from: Jaynie Collins A      Created: Wed Mar 16, 2014  8:29 AM       Patient has impaired glucose tolerance, not overt diabetes.  Needs to follow up with PCP in one year. Please call to inform patient of results and recommendations.  Spanish speaking only. ------

## 2014-03-16 NOTE — Telephone Encounter (Signed)
Called patient with pacific interpreter ID# [id number]. Informed patient of result and recommendation to follow up with PCP in one year to reevaluate. Patient verbalized understanding. Patient asked about antibiotic that was prescribed yesterday-- stated she waited for prescription here but was never given it. Informed patient that the prescription was sent to her [facility] pharmacy and should be ready for pick up. Patient verbalized understanding. No further questions or concerns.

## 2014-04-06 NOTE — Progress Notes (Signed)
I spoke with and examined patient and agree with resident's note and plan of care.  Kable Haywood Ryan Brockton Mckesson, MD OB Fellow 04/06/2014 7:20 PM   

## 2014-08-08 ENCOUNTER — Encounter (HOSPITAL_COMMUNITY): Payer: Self-pay | Admitting: Obstetrics & Gynecology

## 2015-03-04 IMAGING — US US OB COMP +14 WK
1 series · 12 of 28 positions shown · non-contrast
Comparison: none

[Series 1: us ob comp +14 wk · 12 of 99 slices shown]
[im 4/99]
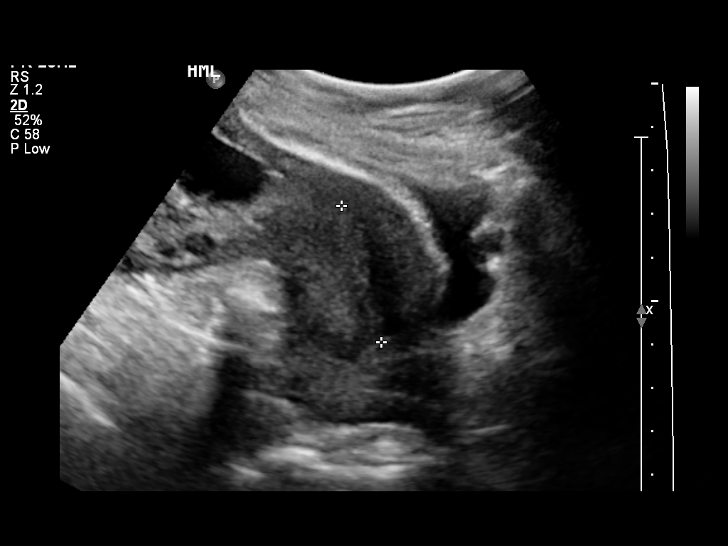
[im 11/99]
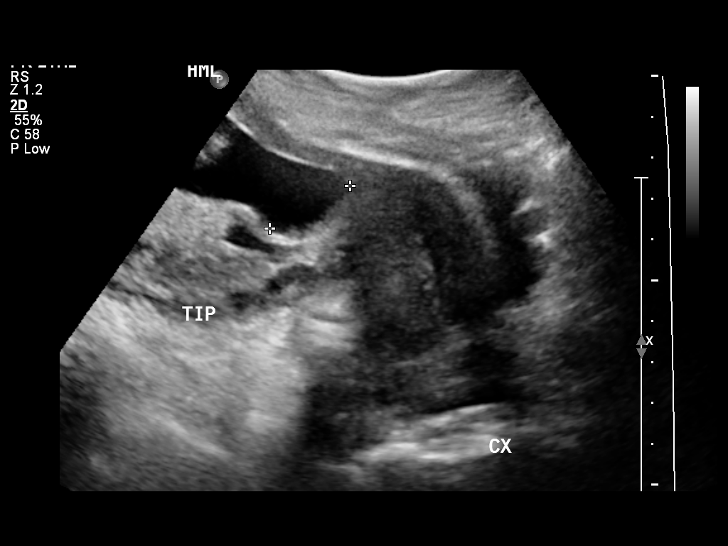
[im 19/99]
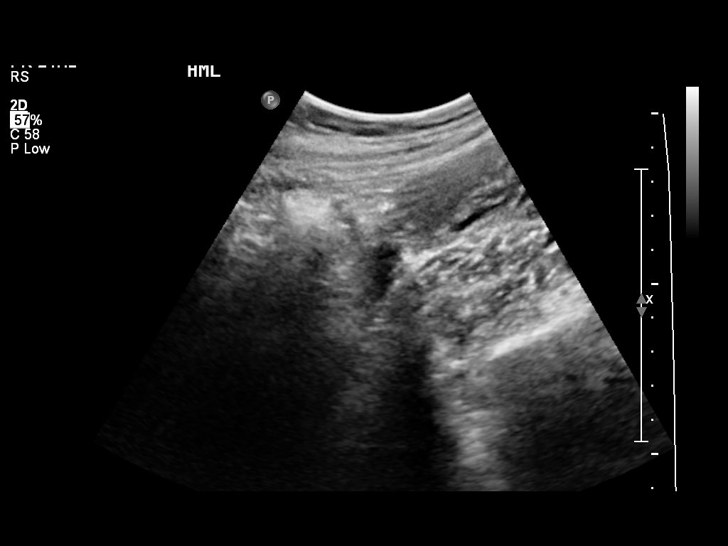
[im 30/99]
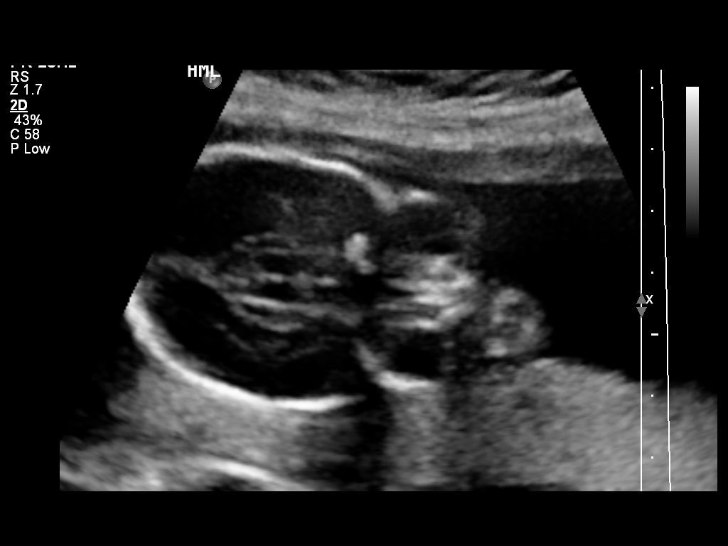
[im 37/99]
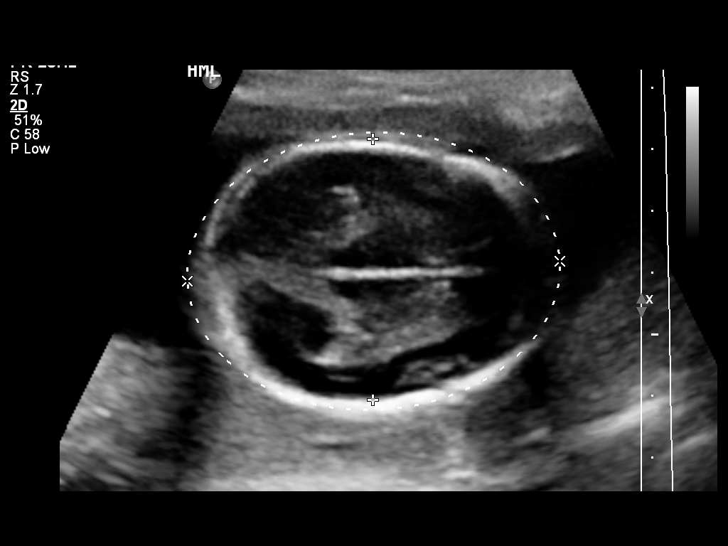
[im 44/99]
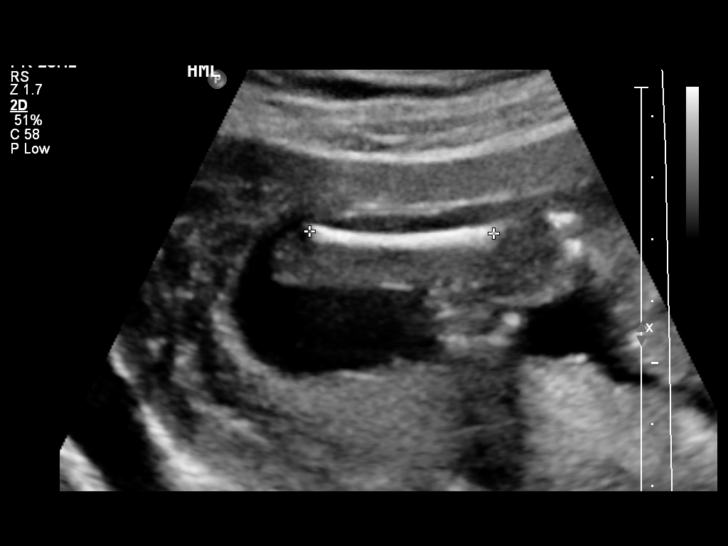
[im 55/99]
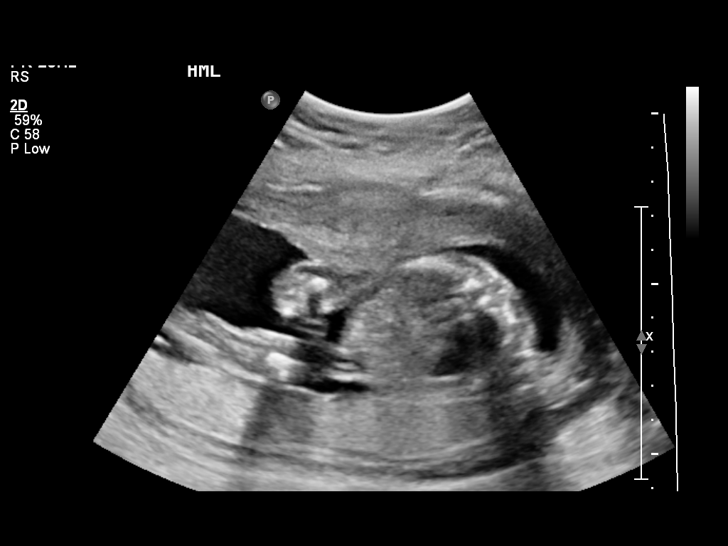
[im 62/99]
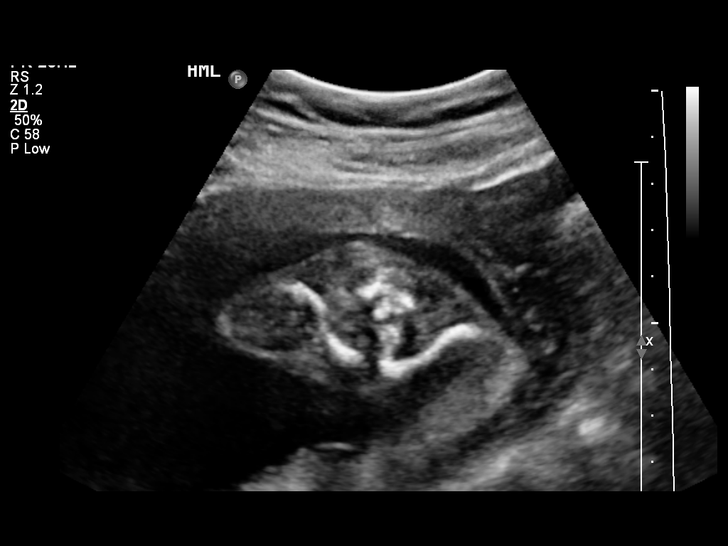
[im 69/99]
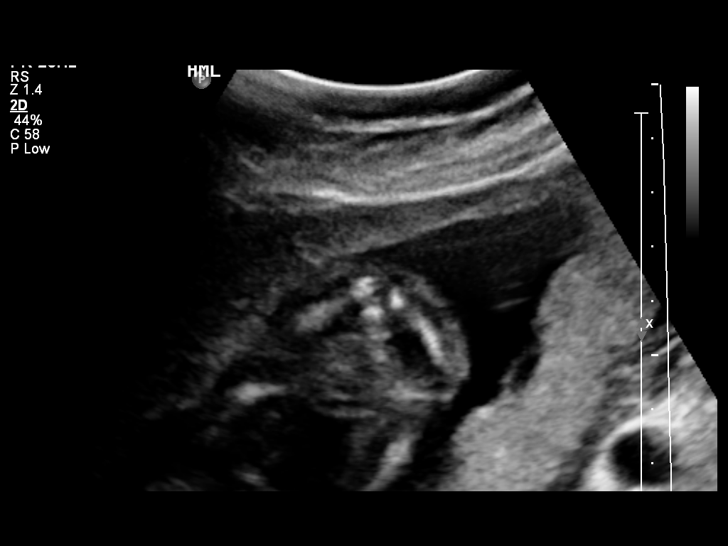
[im 80/99]
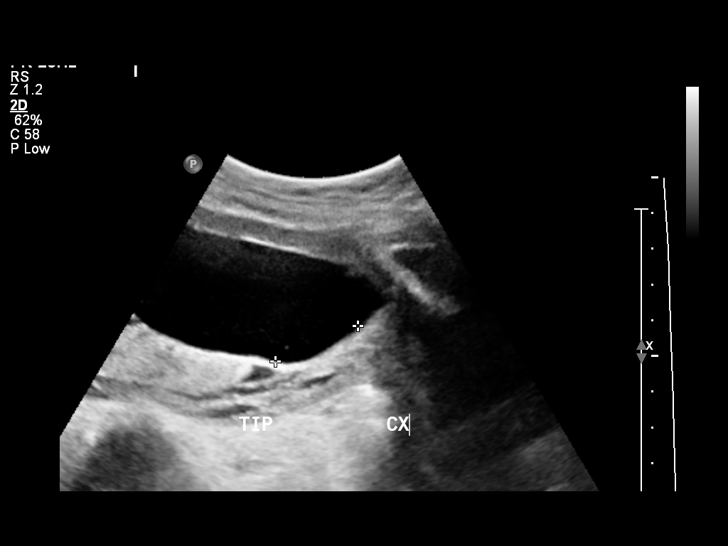
[im 88/99]
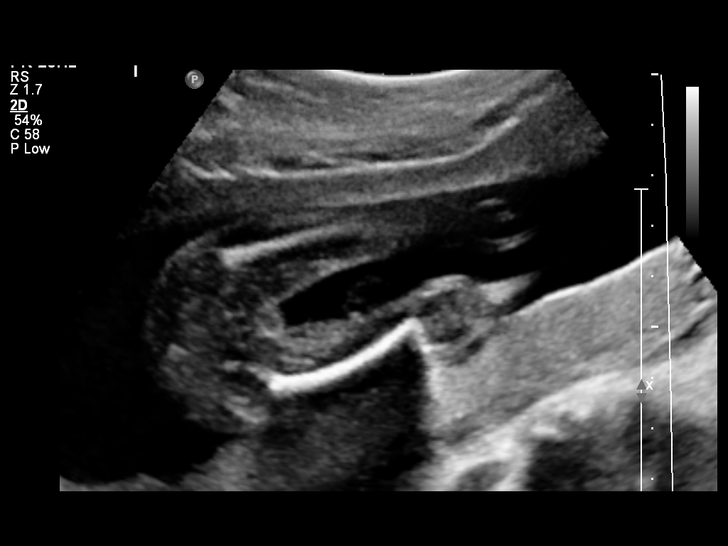
[im 95/99]
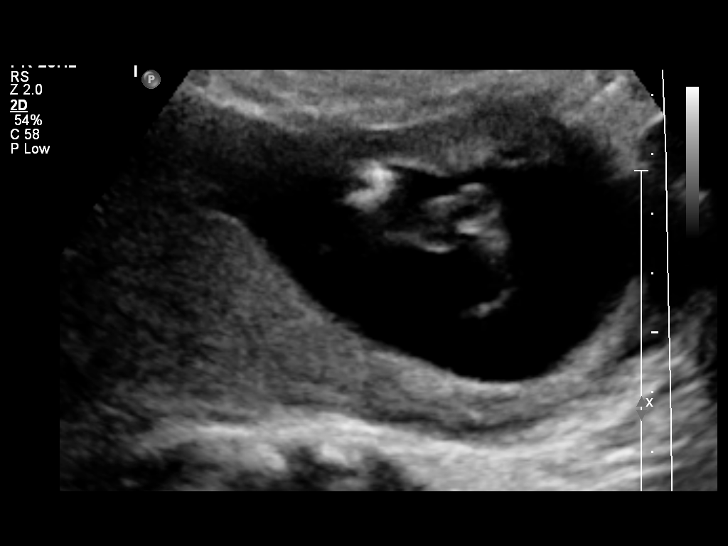

[12 of 28 positions shown; findings below may reference images not displayed]

OBSTETRICS REPORT
                      (Signed Final 09/21/2013 [DATE])

             GEOPGI

Service(s) Provided

 US OB COMP + 14 WK                                    76805.1
Indications

 Basic anatomic survey
Fetal Evaluation

 Num Of Fetuses:    1
 Fetal Heart Rate:  153                          bpm
 Cardiac Activity:  Observed
 Presentation:      Variable
 Placenta:          Posterior, above cervical
                    os
 P. Cord            Visualized, central
 Insertion:

 Amniotic Fluid
 AFI FV:      Subjectively within normal limits
                                             Larg Pckt:    3.66  cm
Biometry

 BPD:     42.3  mm     G. Age:  18w 6d                CI:        67.09   70 - 86
                                                      FL/HC:      19.0   16.1 -

 HC:     165.4  mm     G. Age:  19w 2d       55  %    HC/AC:      1.16   1.09 -

 AC:     143.2  mm     G. Age:  19w 4d       68  %    FL/BPD:
 FL:      31.4  mm     G. Age:  19w 5d       70  %    FL/AC:      21.9   20 - 24
 HUM:     29.6  mm     G. Age:  19w 5d       68  %
 CER:     18.7  mm     G. Age:  18w 2d       31  %

 Est. FW:     304  gm    0 lb 11 oz      55  %
Gestational Age

 LMP:           19w 0d        Date:  05/11/13                 EDD:   02/15/14
 U/S Today:     19w 3d                                        EDD:   02/12/14
 Best:          19w 0d     Det. By:  LMP  (05/11/13)          EDD:   02/15/14
Anatomy
 Cranium:          Appears normal         Aortic Arch:      Appears normal
 Fetal Cavum:      Appears normal         Ductal Arch:      Appears normal
 Ventricles:       Appears normal         Diaphragm:        Not well visualized
 Choroid Plexus:   Appears normal         Stomach:          Appears normal, left
                                                            sided
 Cerebellum:       Appears normal         Abdomen:          Appears normal
 Posterior Fossa:  Appears normal         Abdominal Wall:   Appears nml (cord
                                                            insert, abd wall)
 Nuchal Fold:      Not well visualized    Cord Vessels:     Appears normal (3
                                                            vessel cord)
 Face:             Orbits appear          Kidneys:          Appear normal
                   normal
 Lips:             Not well visualized    Bladder:          Appears normal
 Heart:            Not well visualized    Spine:            Appears normal
 RVOT:             Not well visualized    Lower             Appears normal
                                          Extremities:
 LVOT:             Not well visualized    Upper             Appears normal
                                          Extremities:

 Other:  Fetus appears to be a female. Technically difficult due to maternal
         habitus and fetal position. Heels and 5th digit visualized.
Targeted Anatomy

 Fetal Central Nervous System
 Lat. Ventricles:  7.5                    Cisterna Magna:
Cervix Uterus Adnexa

 Cervical Length:    3.63     cm

 Cervix:       Normal appearance by transabdominal scan.
 Uterus:       No abnormality visualized.
 Cul De Sac:   No free fluid seen.
 Left Ovary:    Size(cm) L: 2.63 x W: 1.77 x H: 1.25  Volume(cc): 3
                No adnexal mass visualized.
 Right Ovary:   No adnexal mass visualized. Not visualized.
 Adnexa:     No adnexal mass visualized.
Impression

 Single IUP at 19 0/7 weeks
 Normal fetal anatomic survey
 Limited views of the fetal heart obtained
 No markers associated with aneuploidy noted
 Normal amniotic fluid volume
Recommendations

 Recommend follow-up ultrasound examination in 4-6  weeks
 to reevaluate the fetal heart

## 2015-06-26 IMAGING — US US OB FOLLOW-UP
1 series · 12 of 27 positions shown · non-contrast
Comparison: none

[Series 1: us ob follow up · 12 of 27 slices shown]
[im 2/27]
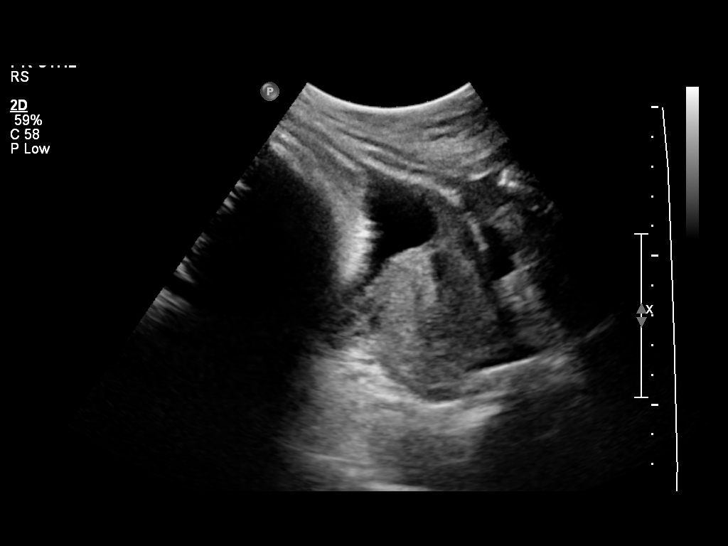
[im 4/27]
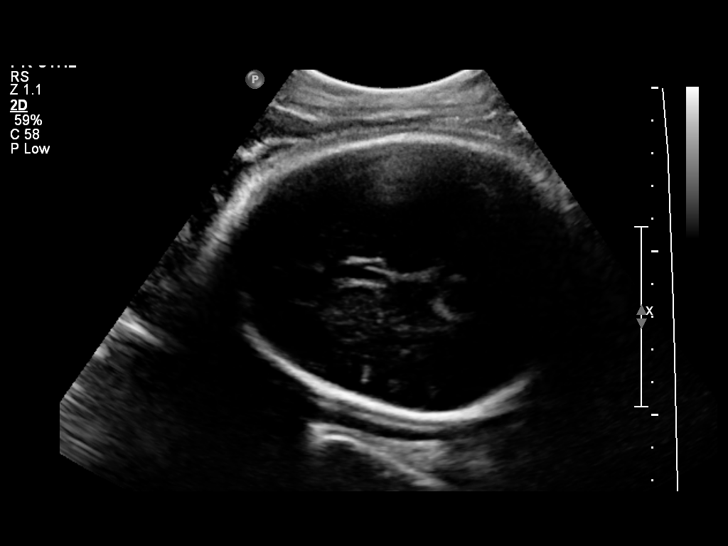
[im 6/27]
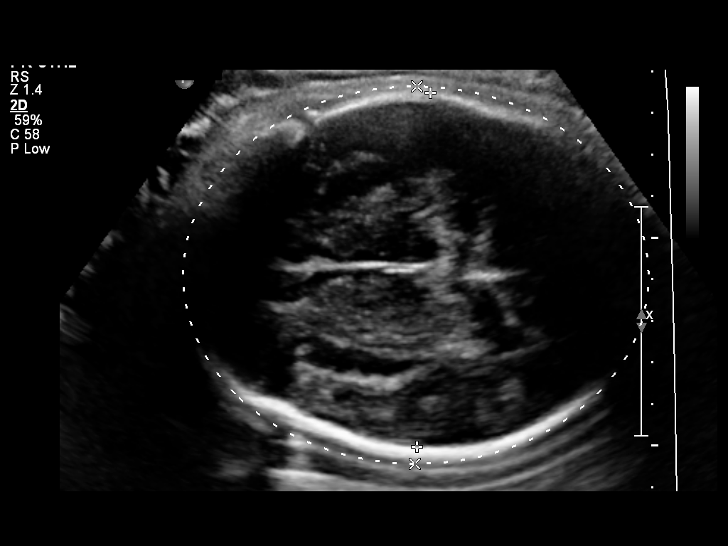
[im 8/27]
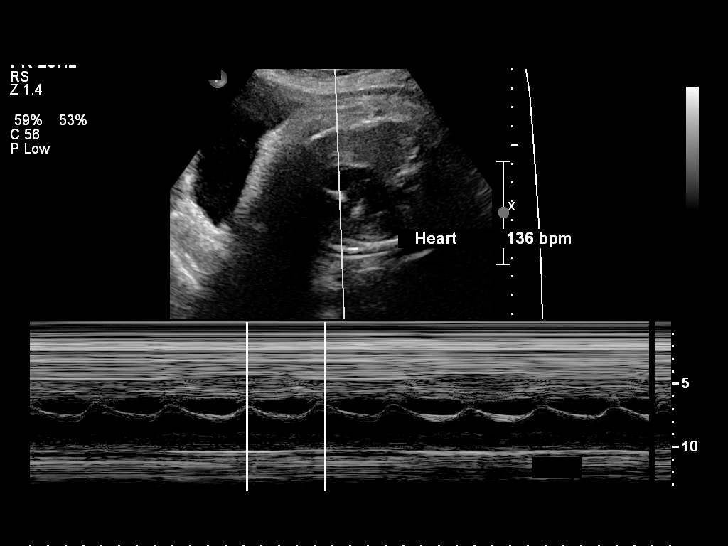
[im 11/27]
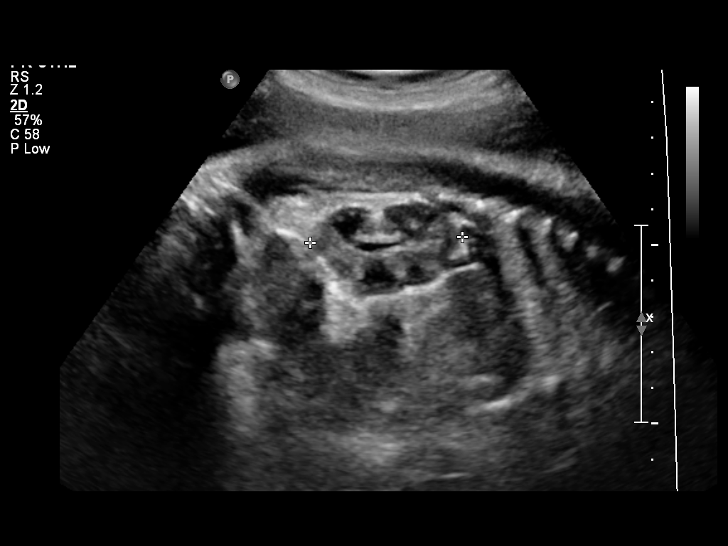
[im 13/27]
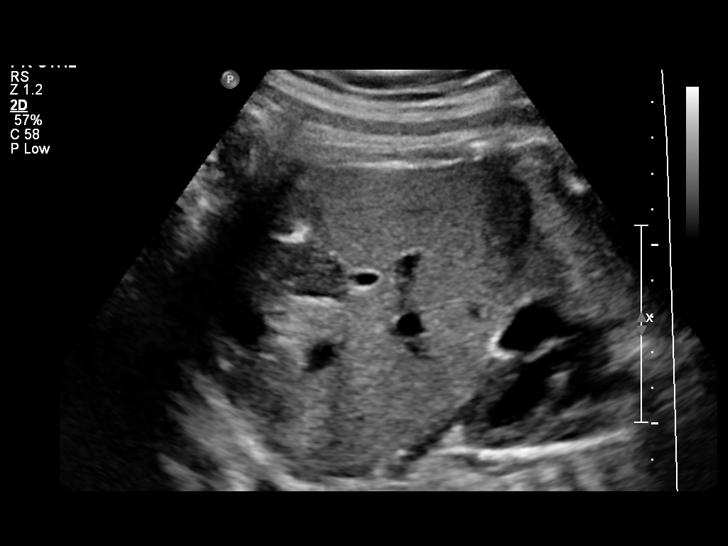
[im 15/27]
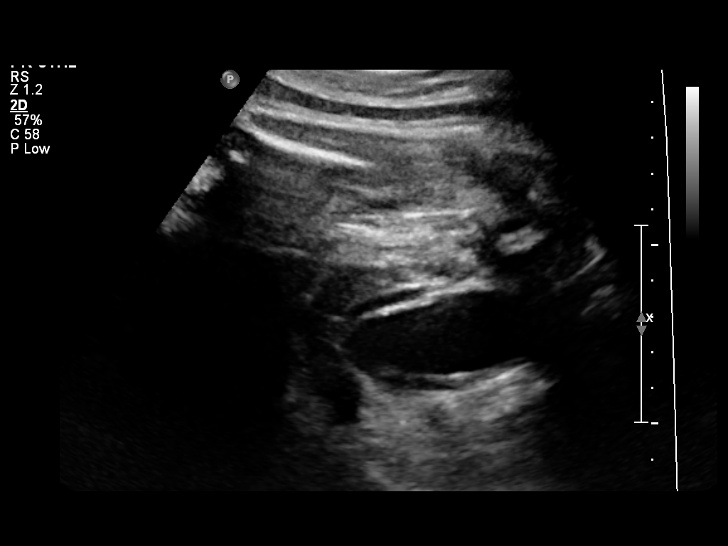
[im 17/27]
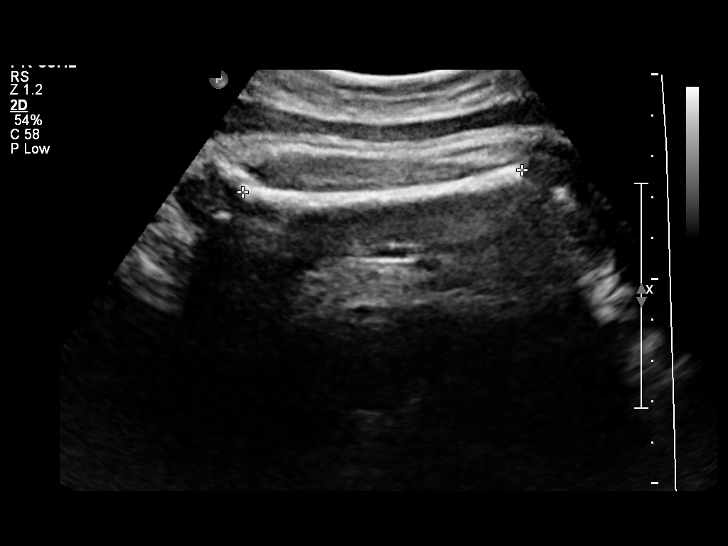
[im 20/27]
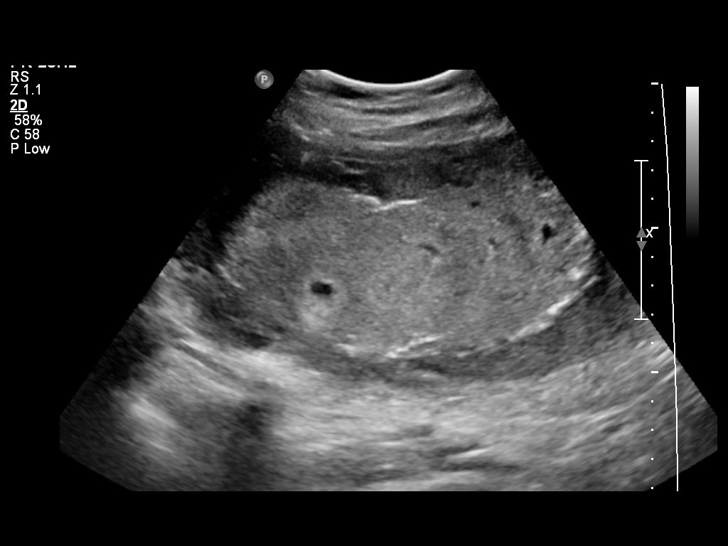
[im 22/27]
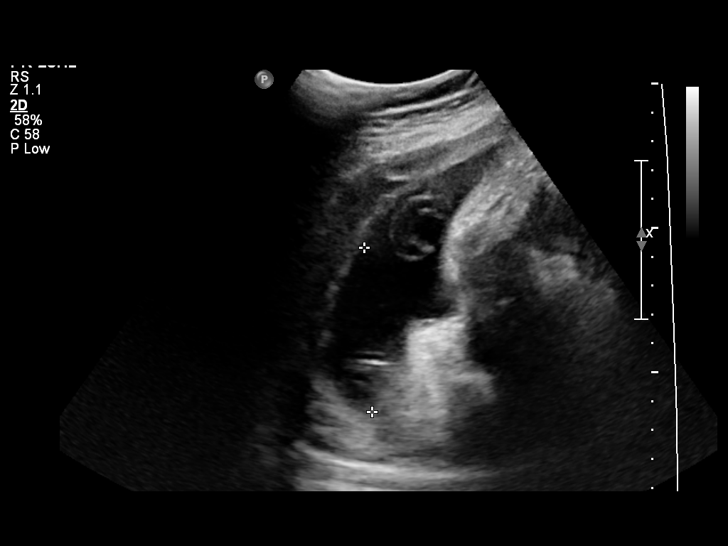
[im 24/27]
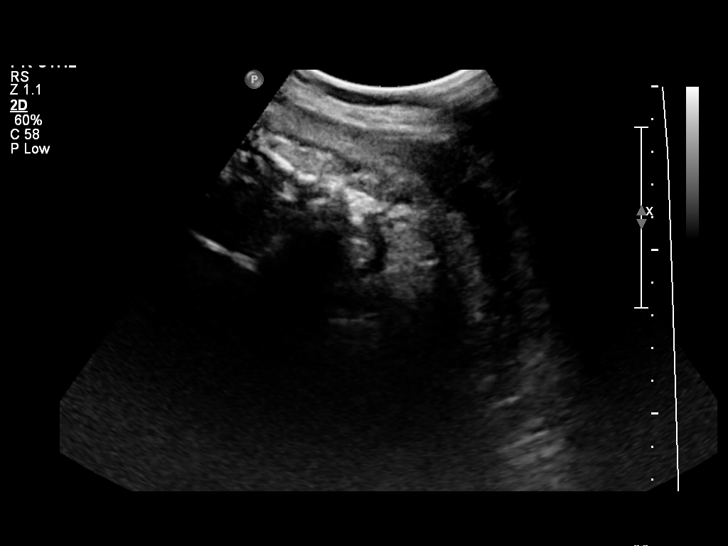
[im 26/27]
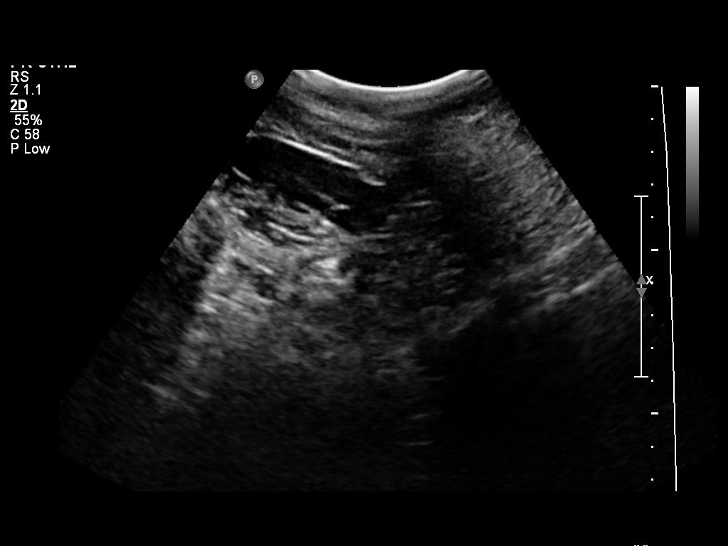

[12 of 27 positions shown; findings below may reference images not displayed]

OBSTETRICS REPORT
                      (Signed Final 01/13/2014 [DATE])

             NIMMO

Service(s) Provided

 US OB FOLLOW UP                                       76816.1
Indications

 Assess fetal growth
 Previous cesarean section
 Diabetes - Gestational, A1 (diet controlled)
Fetal Evaluation

 Num Of Fetuses:    1
 Preg. Location:    Intrauterine
 Fetal Heart Rate:  136                          bpm
 Cardiac Activity:  Observed
 Presentation:      Cephalic
 Placenta:          Fundal, above cervical os
 P. Cord            Previously Visualized
 Insertion:

 Amniotic Fluid
 AFI FV:      Subjectively within normal limits
 AFI Sum:     8.18    cm        7  %Tile     Larg Pckt:    5.67  cm
 RUQ:   2.51    cm   RLQ:    5.67   cm
Biometry

 BPD:     84.7  mm     G. Age:  34w 1d                CI:        71.72   70 - 86
                                                      FL/HC:      21.4   20.1 -

 HC:     318.4  mm     G. Age:  35w 6d       30  %    HC/AC:      0.85   0.93 -

 AC:     373.2  mm     G. Age:  41w 2d     > 97  %    FL/BPD:     80.5   71 - 87
 FL:      68.2  mm     G. Age:  35w 0d       37  %    FL/AC:      18.3   20 - 24
 HUM:     60.4  mm     G. Age:  35w 0d       59  %

 Est. FW:    7681  gm    7 lb 11 oz    > 90  %
Gestational Age

 LMP:           35w 2d        Date:  05/11/13                 EDD:   02/15/14
 U/S Today:     36w 4d                                        EDD:   02/06/14
 Best:          35w 2d     Det. By:  LMP  (05/11/13)          EDD:   02/15/14
Anatomy

 Cranium:          Appears normal         Aortic Arch:      Previously seen
 Fetal Cavum:      Appears normal         Ductal Arch:      Previously seen
 Ventricles:       Appears normal         Diaphragm:        Appears normal
 Choroid Plexus:   Previously seen        Stomach:          Appears normal, left
                                                            sided
 Cerebellum:       Previously seen        Abdomen:          Appears normal
 Posterior Fossa:  Previously seen        Abdominal Wall:   Previously seen
 Nuchal Fold:      Not applicable (>20    Cord Vessels:     Previously seen
                   wks GA)
 Face:             Orbits and profile     Kidneys:          Appear normal
                   previously seen
 Lips:             Previously seen        Bladder:          Appears normal
 Heart:            Appears normal         Spine:            Previously seen
                   (4CH, axis, and
                   situs)
 RVOT:             Previously seen        Lower             Previously seen
                                          Extremities:
 LVOT:             Previously seen        Upper             Previously seen
                                          Extremities:

 Other:  Female gender previously seen. Heels and 5th digit previously seen.
Targeted Anatomy

 Fetal Central Nervous System
 Lat. Ventricles:
Cervix Uterus Adnexa

 Cervical Length:    4.56     cm

 Cervix:       Normal appearance by transabdominal scan.
 Uterus:       No abnormality visualized.
 Cul De Sac:   No free fluid seen.

 Adnexa:     No abnormality visualized.
Impression

 Single IUP at 35w 2d
 A1 GDM
 Estimated fetal weight > 90th %tile (7681 g).  The AC
 measures > 97th %tile.
 Normal interval anatomy
 Normal amniotic fluid volume
Recommendations

 At some risk for LGA fetus - recommend delivery by 39 weeks
 Follow-up ultrasounds as clinically indicated.

## 2016-04-23 DIAGNOSIS — Z8632 Personal history of gestational diabetes: Secondary | ICD-10-CM

## 2016-04-23 HISTORY — DX: Personal history of gestational diabetes: Z86.32

## 2017-03-10 ENCOUNTER — Encounter (HOSPITAL_COMMUNITY): Payer: Self-pay

## 2017-03-10 ENCOUNTER — Emergency Department (HOSPITAL_COMMUNITY)
Admission: EM | Admit: 2017-03-10 | Discharge: 2017-03-11 | Disposition: A | Discharge status: Home / Self Care | Payer: Self-pay | Attending: Emergency Medicine | Admitting: Emergency Medicine

## 2017-03-10 DIAGNOSIS — N12 Tubulo-interstitial nephritis, not specified as acute or chronic: Secondary | ICD-10-CM | POA: Insufficient documentation

## 2017-03-10 LAB — URINALYSIS, ROUTINE W REFLEX MICROSCOPIC
Bilirubin Urine: NEGATIVE
Ketones, ur: NEGATIVE mg/dL
Nitrite: NEGATIVE
PROTEIN: 30 mg/dL — AB
Specific Gravity, Urine: 1.001 — ABNORMAL LOW (ref 1.005–1.030)
pH: 7 (ref 5.0–8.0)

## 2017-03-10 MED ORDER — CEFTRIAXONE SODIUM 1 G IJ SOLR
1.0000 g | Freq: Once | INTRAMUSCULAR | Status: AC
  Administered 2017-03-11: 1 g via INTRAMUSCULAR
  Filled 2017-03-10: qty 10

## 2017-03-10 NOTE — ED Provider Notes (Signed)
[facility]-EMERGENCY DEPT Provider Note   CSN: [id number] Arrival date & time: 03/10/17  1751  By signing my name below, I, [name], attest that this documentation has been prepared under the direction and in the presence of Shia Delaine, Canary Brim, MD. Electronically Signed: [name], ED Scribe. 03/11/17. 12:16 AM.  History   Chief Complaint Chief Complaint  Patient presents with  . Flank Pain   The history is provided by the patient. A language interpreter was used ([place]).   HPI Comments: Victoria Richmond is a 31 y.o. female with a PMHx of frequent UTIs, who presents to the Emergency Department complaining of moderate right flank pain onset 1 week ago. Pt states she has menstrual periods that last for 1 month and afterwards she experiences UTI symptoms. She is curently has an implant in her left arm for birth control. Pt states pain is exacerbated by ambulation. Pt reports associated urinary frequency and dysuria. Pt has tried Fluconazole with no relief of her symptoms. No other complaints at this time.  Past Medical History:  Diagnosis Date  . Anemia   . Breast pain, left 12/08/2012   Referred patient to the [facility] for left breast ultrasound. Appointment scheduled for Monday, December 14, 2012 at 0850.  Marland Kitchen Gestational diabetes     Patient Active Problem List   Diagnosis Date Noted  . Bladder laceration as postoperative complication 01/30/2014  . S/P cesarean section 01/29/2014  . Supervision of other high-risk pregnancy 12/13/2013  . Previous cesarean delivery affecting pregnancy, antepartum 12/13/2013  . Gestational diabetes mellitus, antepartum 12/13/2013  . Breast pain, left 12/08/2012    Past Surgical History:  Procedure Laterality Date  . BLADDER NECK RECONSTRUCTION N/A 01/29/2014   Procedure: Repair of Cystotomy;  Surgeon: Tereso Newcomer, MD;  Location: [facility];  Service: Obstetrics;  Laterality: N/A;  . CESAREAN SECTION    . CESAREAN  SECTION N/A 01/29/2014   Procedure: CESAREAN SECTION;  Surgeon: Tereso Newcomer, MD;  Location: [facility];  Service: Obstetrics;  Laterality: N/A;    OB History    Gravida Para Term Preterm AB Living   3 3 3     3    SAB TAB Ectopic Multiple Live Births           3       Home Medications    Prior to Admission medications   Medication Sig Start Date End Date Taking? Authorizing Provider  cephALEXin (KEFLEX) 500 MG capsule Take 1 capsule (500 mg total) by mouth 2 (two) times daily. 03/11/17   Gilda Crease, MD  glucose blood (TRUE CARE TEST STRIP PACK) test strip Use as instructed 01/24/14   Minta Balsam, MD  ibuprofen (ADVIL,MOTRIN) 800 MG tablet Take 1 tablet (800 mg total) by mouth 3 (three) times daily. 03/11/17   Gilda Crease, MD  promethazine (PHENERGAN) 25 MG tablet Take 1 tablet (25 mg total) by mouth every 6 (six) hours as needed for nausea or vomiting. 03/11/17   Arnice Vanepps, Canary Brim, MD  senna-docusate (SENOKOT-S) 8.6-50 MG per tablet Take 2 tablets by mouth at bedtime as needed for mild constipation. Patient not taking: Reported on 03/11/2017 02/01/14   Smitty Cords, DO  simethicone (MYLICON) 80 MG chewable tablet Chew 1 tablet (80 mg total) by mouth as needed for flatulence. Patient not taking: Reported on 03/11/2017 02/01/14   Smitty Cords, DO  sulfamethoxazole-trimethoprim (BACTRIM DS) 800-160 MG per tablet Take 1 tablet by mouth 2 (two) times  daily. Patient not taking: Reported on 03/11/2017 03/15/14   Anyanwu, Jethro BastosUgonna A, MD  traMADol (ULTRAM) 50 MG tablet Take 1 tablet (50 mg total) by mouth every 6 (six) hours as needed. 03/11/17   Gilda CreasePollina, Jacquetta Polhamus J, MD    Family History Family History  Problem Relation Age of Onset  . Diabetes Paternal Aunt   . Diabetes Mother     Social History Social History  Substance Use Topics  . Smoking status: Never Smoker  . Smokeless tobacco: Never Used  . Alcohol use No     Allergies   Patient has  no known allergies.   Review of Systems Review of Systems  Constitutional: Negative for fever.  Genitourinary: Positive for dysuria, flank pain and frequency.  All other systems reviewed and are negative.    Physical Exam Updated Vital Signs BP 118/80 (BP Location: Right Arm)   Pulse 73   Temp 98.8 F (37.1 C) (Oral)   Resp 20   LMP 03/08/2017 (Within Days)   SpO2 100%   Physical Exam  Constitutional: She is oriented to person, place, and time. She appears well-developed and well-nourished. No distress.  HENT:  Head: Normocephalic and atraumatic.  Right Ear: Hearing normal.  Left Ear: Hearing normal.  Nose: Nose normal.  Mouth/Throat: Oropharynx is clear and moist and mucous membranes are normal.  Eyes: Conjunctivae and EOM are normal. Pupils are equal, round, and reactive to light.  Neck: Normal range of motion. Neck supple.  Cardiovascular: Normal rate, regular rhythm, S1 normal and S2 normal.  Exam reveals no gallop and no friction rub.   No murmur heard. Pulmonary/Chest: Effort normal and breath sounds normal. No respiratory distress. She exhibits no tenderness.  Abdominal: Soft. Normal appearance and bowel sounds are normal. There is no hepatosplenomegaly. There is tenderness. There is no rebound, no guarding, no tenderness at McBurney's point and negative Murphy's sign. No hernia.  Right flank diffuse tenderness.   Musculoskeletal: Normal range of motion.  Neurological: She is alert and oriented to person, place, and time. She has normal strength. No cranial nerve deficit or sensory deficit. Coordination normal. GCS eye subscore is 4. GCS verbal subscore is 5. GCS motor subscore is 6.  Skin: Skin is warm, dry and intact. No rash noted. No cyanosis.  Psychiatric: She has a normal mood and affect. Her speech is normal and behavior is normal. Thought content normal.  Nursing note and vitals reviewed.    ED Treatments / Results  DIAGNOSTIC STUDIES: Oxygen Saturation is  100% on RA, normal by my interpretation.   COORDINATION OF CARE: 12:10 AM-Discussed next steps with pt including treatment with antibiotics and a CT to rule out kidney stones. Pt verbalized understanding and is agreeable with the plan.   Labs (all labs ordered are listed, but only abnormal results are displayed) Labs Reviewed  URINALYSIS, ROUTINE W REFLEX MICROSCOPIC - Abnormal; Notable for the following:       Result Value   APPearance HAZY (*)    Specific Gravity, Urine 1.001 (*)    Glucose, UA >=500 (*)    Hgb urine dipstick LARGE (*)    Protein, ur 30 (*)    Leukocytes, UA LARGE (*)    Bacteria, UA MANY (*)    Squamous Epithelial / LPF 0-5 (*)    All other components within normal limits  URINE CULTURE  PREGNANCY, URINE  POC URINE PREG, ED    EKG  EKG Interpretation None       Radiology Ct  Renal Stone Study  Result Date: 03/11/2017 CLINICAL DATA:  One week ago flank pain. Frequent urinary tract endovaginal infections after getting a Mirena IUD. EXAM: CT ABDOMEN AND PELVIS WITHOUT CONTRAST TECHNIQUE: Multidetector CT imaging of the abdomen and pelvis was performed following the standard protocol without IV contrast. COMPARISON:  None. FINDINGS: Study is limited through the kidneys secondary to motion artifacts. Lower chest: No acute abnormality. Hepatobiliary: The dome of the right hepatic lobe is excluded. No intrahepatic ductal dilatation. Nondistended gallbladder. No gallstones. Pancreas: No space-occupying mass or ductal dilatation. Spleen: No splenomegaly. Adrenals/Urinary Tract: No adrenal mass. Mild nephromegaly. Correlate to exclude urinary tract infection and possibility of pyelonephritis. No obstructive uropathy. The bladder is nondistended but slightly thick-walled in appearance. Cystitis is not excluded. Stomach/Bowel: Normal appendix. No bowel obstruction or definite inflammation though limited due to motion artifacts. Vascular/Lymphatic: No significant vascular  findings are present. No enlarged abdominal or pelvic lymph nodes. Reproductive: An intrauterine device is not visualized within the expected location of the endometrial cavity. Question removal of IUD or dislodgement from the endometrial cavity. Two punctate densities are seen adjacent to the uterus which do not have the appearance of an intrauterine device but are nonspecific possibly representing calcifications or changes secondary to reported history of bladder repair. Other: No abdominal wall hernia or abnormality. No abdominopelvic ascites. Musculoskeletal: No acute or significant osseous findings. IMPRESSION: 1. Limited exam due to motion artifacts. There appears be nephromegaly which can be seen with urinary tract infections possibly pyelonephritis and should be correlated clinically. 2. No acute bowel obstruction or inflammation given limitations from motion artifact. Electronically Signed   By: Tollie Eth M.D.   On: 03/11/2017 03:46    Procedures Procedures (including critical care time)  Medications Ordered in ED Medications  cefTRIAXone (ROCEPHIN) injection 1 g (1 g Intramuscular Given 03/11/17 0027)  ketorolac (TORADOL) 30 MG/ML injection 30 mg (30 mg Intramuscular Given 03/11/17 0024)     Initial Impression / Assessment and Plan / ED Course  I have reviewed the triage vital signs and the nursing notes.  Pertinent labs & imaging results that were available during my care of the patient were reviewed by me and considered in my medical decision making (see chart for details).  Clinical Course as of Mar 11 358  Tue Mar 11, 2017  0333 CT RENAL STONE STUDY [DW]  0333 CT RENAL STONE STUDY [DW]    Clinical Course User Index [DW] [name]    Patient presents to the emergency department for evaluation of right flank pain with urinary frequency and dysuria. Patient reports a history of frequent UTIs in the past. Urinalysis does confirm urinary tract infection. Based on the flank  pain, CT scan was performed to rule out concomitant kidney stone. She does not have any evidence of kidney stone, likely early pyelonephritis. Patient pain free after Toradol. She was administered Rocephin here in the ER will continue Keflex. She is tolerating oral intake, appropriate for outpatient management.  Final Clinical Impressions(s) / ED Diagnoses   Final diagnoses:  Pyelonephritis    New Prescriptions New Prescriptions   CEPHALEXIN (KEFLEX) 500 MG CAPSULE    Take 1 capsule (500 mg total) by mouth 2 (two) times daily.   IBUPROFEN (ADVIL,MOTRIN) 800 MG TABLET    Take 1 tablet (800 mg total) by mouth 3 (three) times daily.   PROMETHAZINE (PHENERGAN) 25 MG TABLET    Take 1 tablet (25 mg total) by mouth every 6 (six) hours as needed for  nausea or vomiting.   TRAMADOL (ULTRAM) 50 MG TABLET    Take 1 tablet (50 mg total) by mouth every 6 (six) hours as needed.   I personally performed the services described in this documentation, which was scribed in my presence. The recorded information has been reviewed and is accurate.     Gilda Crease, MD 03/11/17 [phone]

## 2017-03-10 NOTE — ED Triage Notes (Signed)
Using interpreter line: Pt reports ever since getting the Mirena she has had lots of vaginal infections. Pt also reports frequent urinary infections. Pt reports flank pain today that began approximately 1 week ago. She has been taking Fluconazole with no relief.

## 2017-03-11 ENCOUNTER — Emergency Department (HOSPITAL_COMMUNITY): Payer: Self-pay

## 2017-03-11 LAB — PREGNANCY, URINE: Preg Test, Ur: NEGATIVE

## 2017-03-11 MED ORDER — TRAMADOL HCL 50 MG PO TABS: 50.0000 mg | ORAL_TABLET | Freq: Four times a day (QID) | ORAL | 0 refills | Status: DC | PRN

## 2017-03-11 MED ORDER — PROMETHAZINE HCL 25 MG PO TABS: 25.0000 mg | ORAL_TABLET | Freq: Four times a day (QID) | ORAL | 0 refills | Status: DC | PRN

## 2017-03-11 MED ORDER — IBUPROFEN 800 MG PO TABS
800.0000 mg | ORAL_TABLET | Freq: Three times a day (TID) | ORAL | 0 refills | Status: DC
Start: 2017-03-11 — End: 2020-06-10

## 2017-03-11 MED ORDER — KETOROLAC TROMETHAMINE 30 MG/ML IJ SOLN
30.0000 mg | Freq: Once | INTRAMUSCULAR | Status: AC
  Administered 2017-03-11: 30 mg via INTRAMUSCULAR
  Filled 2017-03-11: qty 1

## 2017-03-11 MED ORDER — CEPHALEXIN 500 MG PO CAPS: 500.0000 mg | ORAL_CAPSULE | Freq: Two times a day (BID) | ORAL | 0 refills | Status: DC

## 2017-03-11 NOTE — ED Notes (Signed)
Pt verbalized understanding of d/c instructions and has no further questions. Pt is stable, A&Ox4, VSS.  

## 2017-03-13 LAB — URINE CULTURE: Culture: >=100000 — AB

## 2017-03-14 ENCOUNTER — Telehealth: Payer: Self-pay

## 2017-03-14 NOTE — Telephone Encounter (Signed)
Post ED Visit - Positive Culture Follow-up  Culture report reviewed by antimicrobial stewardship pharmacist:  []  Victoria Richmond, Pharm.D. []  Victoria Richmond, Pharm.D., BCPS AQ-ID []  Victoria Richmond, Pharm.D., BCPS []  Victoria Richmond, Pharm.D., BCPS []  Victoria Richmond, [address]Pharm[address]D., BCPS, AAHIVP []  Victoria Richmond, Pharm.D., BCPS, AAHIVP []  Victoria Richmond, PharmD, BCPS []  Victoria Richmond, PharmD, BCPS [x]  Victoria Richmond, PharmD, BCPS  Positive urine culture Treated with Cephaleixn, organism sensitive to the same and no further patient follow-up is required at this time.  Victoria Richmond, Victoria Richmond 03/14/2017, 9:48 AM

## 2017-04-24 ENCOUNTER — Encounter: Payer: Self-pay | Admitting: Family Medicine

## 2017-04-24 ENCOUNTER — Ambulatory Visit: Payer: Self-pay | Attending: Family Medicine | Admitting: Family Medicine

## 2017-04-24 VITALS — BP 98/68 | HR 71 | Temp 98.1°F | Resp 18 | Ht <= 58 in | Wt 129.2 lb

## 2017-04-24 DIAGNOSIS — Z09 Encounter for follow-up examination after completed treatment for conditions other than malignant neoplasm: Secondary | ICD-10-CM

## 2017-04-24 DIAGNOSIS — M545 Low back pain: Secondary | ICD-10-CM | POA: Insufficient documentation

## 2017-04-24 DIAGNOSIS — Z87448 Personal history of other diseases of urinary system: Secondary | ICD-10-CM | POA: Insufficient documentation

## 2017-04-24 DIAGNOSIS — N3000 Acute cystitis without hematuria: Secondary | ICD-10-CM | POA: Insufficient documentation

## 2017-04-24 DIAGNOSIS — R7303 Prediabetes: Secondary | ICD-10-CM | POA: Insufficient documentation

## 2017-04-24 DIAGNOSIS — Z8632 Personal history of gestational diabetes: Secondary | ICD-10-CM | POA: Insufficient documentation

## 2017-04-24 LAB — POCT URINALYSIS DIPSTICK
Bilirubin, UA: NEGATIVE
Blood, UA: NEGATIVE
Glucose, UA: NEGATIVE
Ketones, UA: NEGATIVE
NITRITE UA: NEGATIVE
PROTEIN UA: NEGATIVE
Spec Grav, UA: 1.015 (ref 1.010–1.025)
UROBILINOGEN UA: 0.2 U/dL
pH, UA: 6.5 (ref 5.0–8.0)

## 2017-04-24 LAB — POCT GLYCOSYLATED HEMOGLOBIN (HGB A1C): HEMOGLOBIN A1C: 5.8

## 2017-04-24 MED ORDER — NITROFURANTOIN MONOHYD MACRO 100 MG PO CAPS
100.0000 mg | ORAL_CAPSULE | Freq: Two times a day (BID) | ORAL | 0 refills | Status: DC
Start: 2017-04-24 — End: 2017-07-05

## 2017-04-24 MED FILL — NITROFURANTOIN MONO-MCR 100: 100 | 5 days supply | Qty: 10 | Fill #0

## 2017-04-24 NOTE — Patient Instructions (Signed)
Plan de alimentacin para la prediabetes (Prediabetes Eating Plan) La prediabetes, tambin llamada intolerancia a la glucosa o alteracin de la glucosa en ayunas, es una afeccin que eleva los niveles de azcar en la sangre (glucemia) por encima de lo normal. Seguir una dieta saludable puede ayudar a mantener la prediabetes bajo control, y tambin reduce el riesgo de tener diabetes tipo2 y cardiopata, que es ms alto en las personas que tienen esta afeccin. Junto con la actividad fsica habitual, una dieta saludable:  Promueve la prdida de peso.  Ayuda a controlar los niveles de azcar en la sangre.  Ayuda a mejorar la forma en que el organismo usa la insulina. QU DEBO SABER ACERCA DE ESTE PLAN DE ALIMENTACIN?  Use el ndice glucmico (IG) para planificar las comidas. El ndice le informa con qu rapidez un alimento elevar su nivel de azcar en la sangre. Elija los alimentos con bajo IG. Estos tardan ms tiempo en subir el nivel de azcar en la sangre.  Preste mucha atencin a la cantidad de hidratos de carbono que hay en los alimentos que consume. Los hidratos de carbono aumentan los niveles de azcar en la sangre.  Lleve un registro de la cantidad de caloras que ingiere. Ingerir la cantidad correcta de caloras lo ayudar a alcanzar un peso saludable. Bajar alrededor del 7por ciento del peso inicial puede ayudar a evitar la diabetes tipo2.  Tal vez deba seguir una dieta mediterrnea. Esta incluye una gran cantidad de verduras, carnes magras o pescado, cereales integrales, frutas, as como aceites y grasas saludables.  QU ALIMENTOS PUEDO COMER? Cereales Cereales integrales, como panes, galletas, cereales y pastas de salvado o integrales. Avena sin azcar. Trigo burgol. Cebada. Quinua. Arroz integral. Tortillas o tacos de harina de maz o de salvado. Verduras Lechuga. Espinaca. Guisantes. Remolachas. Coliflor. Repollo. Brcoli. Zanahorias. Tomates. Calabaza. Berenjena. Hierbas.  Pimientos. Cebollas. Pepinos. Repollitos de Bruselas. Frutas Frutos rojos. Bananas. Manzanas. Naranjas. Uvas. Papaya. Mango. Granada. Kiwi. Pomelo. Cerezas. Carnes y otras fuentes de protenas Mariscos. Carnes magras, entre ellas, pollo y pavo o cortes magros de carne de cerdo y de vaca. Tofu. Huevos. Los frutos secos. Frijoles. Lcteos Productos lcteos descremados o semidescremados, como yogur, queso cottage y queso. Bebidas Agua. T. Caf. Gaseosas sin azcar o dietticas. Agua de Seltz. Leche. Productos alternativos de la leche, como leche de soja o de almendra. Condimentos Mostaza. Salsa de pepinillos. Ktchup con bajo contenido de grasa y de azcar. Salsa barbacoa con bajo contenido de grasa y de azcar. Mayonesa sin grasa o con bajo contenido de grasa. Dulces y postres Budines sin azcar o con bajo contenido de grasa. Helados y otros dulces congelados sin azcar o con bajo contenido de grasa. Grasas y aceites [place]. Nueces. Aceite de oliva. Los artculos mencionados arriba pueden no ser una lista completa de las bebidas o los alimentos recomendados. Comunquese con el nutricionista para conocer ms opciones. QU ALIMENTOS NO SE RECOMIENDAN? Cereales Productos a base de harina y de harina blanca refinada, como panes, pastas, bocadillos y cereales. Bebidas Bebidas azucaradas, como t helado y gaseosas con azcar. Dulces y postres Productos de panadera, como tortas, madalenas, pasteles, galletitas y tarta de queso. Los artculos mencionados arriba pueden no ser una lista completa de las bebidas y los alimentos que se deben evitar. Comunquese con el nutricionista para obtener ms informacin. Esta informacin no tiene como fin reemplazar el consejo del mdico. Asegrese de hacerle al mdico cualquier pregunta que tenga. Document Released: 06/14/2015 Document Revised: 06/14/2015 Document Reviewed: 10/19/2014 Elsevier   Interactive Patient Education  2017 Elsevier Inc.  

## 2017-04-24 NOTE — Progress Notes (Signed)
Subjective:  Patient ID: Victoria Richmond, female    DOB: 25-Jun-1986  Age: 31 y.o. MRN: [id number]  CC: Establish Care  HPI Victoria Richmond presents for follow up. History of pyelonephritis. She reports completing her course of antibiotics. She denies any dysuria, chills, or fever. She also complaints  of  low back pain.The patient first noted symptoms a few weeks ago. It was not related to no known injury. The pain is rated 3 /10, and is located at the lumbar. The pain is described as aching and occurs intermittently. The symptoms denies been progressive.Other associated symptoms include pins and needles sensations to the bilateral lower back.   Treatment efforts have included none   Outpatient Medications Prior to Visit  Medication Sig Dispense Refill  . cephALEXin (KEFLEX) 500 MG capsule Take 1 capsule (500 mg total) by mouth 2 (two) times daily. 20 capsule 0  . glucose blood (TRUE CARE TEST STRIP PACK) test strip Use as instructed 100 each 12  . ibuprofen (ADVIL,MOTRIN) 800 MG tablet Take 1 tablet (800 mg total) by mouth 3 (three) times daily. 21 tablet 0  . promethazine (PHENERGAN) 25 MG tablet Take 1 tablet (25 mg total) by mouth every 6 (six) hours as needed for nausea or vomiting. 10 tablet 0  . senna-docusate (SENOKOT-S) 8.6-50 MG per tablet Take 2 tablets by mouth at bedtime as needed for mild constipation. 30 tablet 0  . simethicone (MYLICON) 80 MG chewable tablet Chew 1 tablet (80 mg total) by mouth as needed for flatulence. 30 tablet 0  . traMADol (ULTRAM) 50 MG tablet Take 1 tablet (50 mg total) by mouth every 6 (six) hours as needed. 15 tablet 0  . sulfamethoxazole-trimethoprim (BACTRIM DS) 800-160 MG per tablet Take 1 tablet by mouth 2 (two) times daily. 14 tablet 1   No facility-administered medications prior to visit.     ROS Review of Systems  Constitutional: Negative.   Respiratory: Negative.   Cardiovascular: Negative.   Gastrointestinal: Negative.   Negative for abdominal pain.  Genitourinary: Negative for dysuria and pelvic pain.  Musculoskeletal: Positive for back pain.  Skin: Negative.    Objective:  BP 98/68 (BP Location: Left Arm, Patient Position: Sitting)   Pulse 71   Temp 98.1 F (36.7 C) (Oral)   Resp 18   Ht 4\' 10"  (1.473 m)   Wt 129 lb 3.2 oz (58.6 kg)   LMP 03/26/2017 (Approximate)   SpO2 100%   BMI 27.00 kg/m   BP/Weight 04/24/2017 03/11/2017 03/15/2014  Systolic BP 98 101 97  Diastolic BP 68 60 69  Wt. (Lbs) 129.2 - -  BMI 27 - -     Physical Exam  Constitutional: She appears well-developed and well-nourished.  Eyes: Pupils are equal, round, and reactive to light. Conjunctivae are normal.  Neck: No JVD present.  Cardiovascular: Normal rate, regular rhythm, normal heart sounds and intact distal pulses.   Pulmonary/Chest: Effort normal and breath sounds normal.  Abdominal: Soft. Bowel sounds are normal. There is no tenderness.  Skin: Skin is warm and dry.  Nursing note and vitals reviewed.   Assessment & Plan:   Problem List Items Addressed This Visit    None    Visit Diagnoses    Follow up    -  Primary   Relevant Medications   nitrofurantoin, macrocrystal-monohydrate, (MACROBID) 100 MG capsule   Other Relevant Orders   Urinalysis Dipstick (Completed)   Urine Culture (Completed)   Acute cystitis without hematuria  Relevant Medications   nitrofurantoin, macrocrystal-monohydrate, (MACROBID) 100 MG capsule   Prediabetes        Begin to incorporate  healthy lifestyle changes and exercise changes.     History of pyelonephritis       Relevant Orders   Urinalysis Dipstick (Completed)   Urine Culture (Completed)   History of gestational diabetes       Relevant Orders   POCT glycosylated hemoglobin (Hb A1C) (Completed)      Meds ordered this encounter  Medications  . nitrofurantoin, macrocrystal-monohydrate, (MACROBID) 100 MG capsule    Sig: Take 1 capsule (100 mg total) by mouth 2 (two)  times daily.    Dispense:  10 capsule    Refill:  0    Order Specific Question:   Supervising Provider    Answer:   Quentin Angst [[id number]]    Follow-up: Return in about 3 months (around 07/25/2017) for Pre-diabetes.   Lizbeth Bark FNP

## 2017-04-24 NOTE — Progress Notes (Signed)
Patient has eaten  Patient has had her medication

## 2017-04-26 LAB — URINE CULTURE: Organism ID, Bacteria: NO GROWTH

## 2017-05-01 ENCOUNTER — Telehealth: Payer: Self-pay

## 2017-05-01 NOTE — Telephone Encounter (Signed)
-----   Message from Lizbeth BarkMandesia R Hairston, FNP sent at 04/29/2017  2:58 PM EDT ----- Urine culture normal no growth

## 2017-05-01 NOTE — Telephone Encounter (Signed)
CMA call regarding lab results   Patient Verify DOB   Patient was aware and understood  

## 2017-07-05 ENCOUNTER — Encounter (HOSPITAL_COMMUNITY): Payer: Self-pay | Admitting: *Deleted

## 2017-07-05 ENCOUNTER — Emergency Department (HOSPITAL_COMMUNITY)
Admission: EM | Admit: 2017-07-05 | Discharge: 2017-07-05 | Disposition: A | Discharge status: Home / Self Care | Payer: Self-pay | Attending: Emergency Medicine | Admitting: Emergency Medicine

## 2017-07-05 DIAGNOSIS — R1010 Upper abdominal pain, unspecified: Secondary | ICD-10-CM

## 2017-07-05 DIAGNOSIS — K2900 Acute gastritis without bleeding: Secondary | ICD-10-CM | POA: Insufficient documentation

## 2017-07-05 LAB — COMPREHENSIVE METABOLIC PANEL
ALBUMIN: 3.8 g/dL (ref 3.5–5.0)
ALK PHOS: 62 U/L (ref 38–126)
ALT: 13 U/L — AB (ref 14–54)
ANION GAP: 8 (ref 5–15)
AST: 19 U/L (ref 15–41)
BUN: 9 mg/dL (ref 6–20)
CALCIUM: 8.6 mg/dL — AB (ref 8.9–10.3)
CHLORIDE: 108 mmol/L (ref 101–111)
CO2: 19 mmol/L — AB (ref 22–32)
Creatinine, Ser: 0.65 mg/dL (ref 0.44–1.00)
GFR calc Af Amer: >60 mL/min (ref >60)
GFR calc non Af Amer: >60 mL/min (ref >60)
GLUCOSE: 104 mg/dL — AB (ref 65–99)
Potassium: 3.8 mmol/L (ref 3.5–5.1)
SODIUM: 135 mmol/L (ref 135–145)
Total Bilirubin: 0.7 mg/dL (ref 0.3–1.2)
Total Protein: 6.9 g/dL (ref 6.5–8.1)

## 2017-07-05 LAB — URINALYSIS, ROUTINE W REFLEX MICROSCOPIC
BILIRUBIN URINE: NEGATIVE
GLUCOSE, UA: NEGATIVE mg/dL
HGB URINE DIPSTICK: NEGATIVE
Ketones, ur: 5 mg/dL — AB
Leukocytes, UA: NEGATIVE
Nitrite: NEGATIVE
PH: 5 (ref 5.0–8.0)
Protein, ur: NEGATIVE mg/dL
SPECIFIC GRAVITY, URINE: 1.017 (ref 1.005–1.030)

## 2017-07-05 LAB — CBC
HCT: 39.6 % (ref 36.0–46.0)
HEMOGLOBIN: 13.3 g/dL (ref 12.0–15.0)
MCH: 29.5 pg (ref 26.0–34.0)
MCHC: 33.6 g/dL (ref 30.0–36.0)
MCV: 87.8 fL (ref 78.0–100.0)
Platelets: 222 10*3/uL (ref 150–400)
RBC: 4.51 MIL/uL (ref 3.87–5.11)
RDW: 13.7 % (ref 11.5–15.5)
WBC: 6.8 10*3/uL (ref 4.0–10.5)

## 2017-07-05 LAB — POC URINE PREG, ED: PREG TEST UR: NEGATIVE

## 2017-07-05 LAB — LIPASE, BLOOD: LIPASE: 33 U/L (ref 11–51)

## 2017-07-05 MED ORDER — SUCRALFATE 1 GM/10ML PO SUSP
1.0000 g | Freq: Once | ORAL | Status: AC
  Administered 2017-07-05: 1 g via ORAL
  Filled 2017-07-05 (×2): qty 10

## 2017-07-05 MED ORDER — FAMOTIDINE 20 MG PO TABS
20.0000 mg | ORAL_TABLET | Freq: Once | ORAL | Status: AC
  Administered 2017-07-05: 20 mg via ORAL
  Filled 2017-07-05: qty 1

## 2017-07-05 MED ORDER — OMEPRAZOLE 20 MG PO CPDR: 20.0000 mg | DELAYED_RELEASE_CAPSULE | Freq: Every day | ORAL | 0 refills | Status: DC

## 2017-07-05 NOTE — ED Triage Notes (Signed)
The pt is c/o abd pain  With naisea and diarrhea since yesterday.  lmp last month

## 2017-07-05 NOTE — ED Notes (Signed)
Hooked patient up to the monitor patient is resting with call bell in reach 

## 2017-07-05 NOTE — ED Provider Notes (Signed)
[facility]-EMER[facility]Y DEPT Provider Note   CSN: [id number] Arrival date & time: 07/05/17  0146     History   Chief Complaint Chief Complaint  Patient presents with  . Abdominal Pain    HPI Dawson Hollman is a 31 y.o. female.  HPI Patient is a 31 year old female presents the emergency department with an epigastric abdominal discomfort and burning sensation.  She's had nausea without vomiting.  Reports diarrhea since yesterday.  Denies dysuria or urinary frequency.  No fevers or chills.  Symptoms are mild in severity.  No recent sick contacts.  No other complaints at this time.  No chest pain shortness breath.  No back pain.  No cough.  Denies excessive use of ibuprofen or other over-the-counter anti-inflammatories.  No blood in the vomit   Past Medical History:  Diagnosis Date  . Anemia   . Breast pain, left 12/08/2012   Referred patient to the [facility] for left breast ultrasound. Appointment scheduled for Monday, December 14, 2012 at 0850.  Marland Kitchen Gestational diabetes     Patient Active Problem List   Diagnosis Date Noted  . Bladder laceration as postoperative complication 01/30/2014  . S/P cesarean section 01/29/2014  . Supervision of other high-risk pregnancy 12/13/2013  . Previous cesarean delivery affecting pregnancy, antepartum 12/13/2013  . Gestational diabetes mellitus, antepartum 12/13/2013  . Breast pain, left 12/08/2012    Past Surgical History:  Procedure Laterality Date  . BLADDER NECK RECONSTRUCTION N/A 01/29/2014   Procedure: Repair of Cystotomy;  Surgeon: Tereso Newcomer, MD;  Location: [facility];  Service: Obstetrics;  Laterality: N/A;  . CESAREAN SECTION    . CESAREAN SECTION N/A 01/29/2014   Procedure: CESAREAN SECTION;  Surgeon: Tereso Newcomer, MD;  Location: [facility];  Service: Obstetrics;  Laterality: N/A;    OB History    Gravida Para Term Preterm AB Living   SAB TAB Ectopic Multiple Live Births           3       Home  Medications    Prior to Admission medications   Medication Sig Start Date End Date Taking? Authorizing Provider  acetaminophen (TYLENOL) 500 MG tablet Take 500 mg by mouth every 6 (six) hours as needed for mild pain.   Yes [provider]  OVER THE COUNTER MEDICATION Take 1 tablet by mouth 2 (two) times daily as needed (acid). Unknown name for indigestion or gastritis   Yes [provider]  ibuprofen (ADVIL,MOTRIN) 800 MG tablet Take 1 tablet (800 mg total) by mouth 3 (three) times daily. Patient not taking: Reported on 07/05/2017 03/11/17   Gilda Crease, MD  omeprazole (PRILOSEC) 20 MG capsule Take 1 capsule (20 mg total) by mouth daily. 07/05/17   Azalia Bilis, MD  promethazine (PHENERGAN) 25 MG tablet Take 1 tablet (25 mg total) by mouth every 6 (six) hours as needed for nausea or vomiting. Patient not taking: Reported on 07/05/2017 03/11/17   Gilda Crease, MD  senna-docusate (SENOKOT-S) 8.6-50 MG per tablet Take 2 tablets by mouth at bedtime as needed for mild constipation. Patient not taking: Reported on 07/05/2017 02/01/14   Smitty Cords, DO  simethicone (MYLICON) 80 MG chewable tablet Chew 1 tablet (80 mg total) by mouth as needed for flatulence. Patient not taking: Reported on 07/05/2017 02/01/14   Smitty Cords, DO  traMADol (ULTRAM) 50 MG tablet Take 1 tablet (50 mg total) by mouth every  6 (six) hours as needed. Patient not taking: Reported on 07/05/2017 03/11/17   Gilda Crease, MD    Family History Family History  Problem Relation Age of Onset  . Diabetes Paternal Aunt   . Diabetes Mother     Social History Social History  Substance Use Topics  . Smoking status: Never Smoker  . Smokeless tobacco: Never Used  . Alcohol use No     Allergies   Patient has no known allergies.   Review of Systems Review of Systems  All other systems reviewed and are negative.    Physical Exam Updated Vital Signs BP 107/75  (BP Location: Right Arm)   Pulse 76   Temp 98.3 F (36.8 C) (Oral)   Resp 20   Ht  (1.6 m)   Wt 59 kg (130 lb)   LMP 06/04/2017   SpO2 99%   BMI 23.03 kg/m   Physical Exam  Constitutional: She is oriented to person, place, and time. She appears well-developed and well-nourished. No distress.  HENT:  Head: Normocephalic and atraumatic.  Eyes: EOM are normal.  Neck: Normal range of motion.  Cardiovascular: Normal rate, regular rhythm and normal heart sounds.   Pulmonary/Chest: Effort normal and breath sounds normal.  Abdominal: Soft. She exhibits no distension and no mass. There is no tenderness. There is no guarding.  Musculoskeletal: Normal range of motion.  Neurological: She is alert and oriented to person, place, and time.  Skin: Skin is warm and dry.  Psychiatric: She has a normal mood and affect. Judgment normal.  Nursing note and vitals reviewed.    ED Treatments / Results  Labs (all labs ordered are listed, but only abnormal results are displayed) Labs Reviewed  COMPREHENSIVE METABOLIC PANEL - Abnormal; Notable for the following:       Result Value   CO2 19 (*)    Glucose, Bld 104 (*)    Calcium 8.6 (*)    ALT 13 (*)    All other components within normal limits  URINALYSIS, ROUTINE W REFLEX MICROSCOPIC - Abnormal; Notable for the following:    Ketones, ur 5 (*)    All other components within normal limits  LIPASE, BLOOD  CBC  POC URINE PREG, ED    EKG  EKG Interpretation None       Radiology No results found.  Procedures Procedures (including critical care time)  Medications Ordered in ED Medications  sucralfate (CARAFATE) 1 GM/10ML suspension 1 g (1 g Oral Given 07/05/17 1041)  famotidine (PEPCID) tablet 20 mg (20 mg Oral Given 07/05/17 1041)     Initial Impression / Assessment and Plan / ED Course  I have reviewed the triage vital signs and the nursing notes.  Pertinent labs & imaging results that were available during my care of the  patient were reviewed by me and considered in my medical decision making (see chart for details).     11:43 AM Patient feels better this time.  Symptoms improved here in the ER with treatment.  Suspect this is gastritis and gastroesophageal reflux disease.  No fevers or chills.    Final Clinical Impressions(s) / ED Diagnoses   Final diagnoses:  Upper abdominal pain  Acute gastritis without hemorrhage, unspecified gastritis type    New Prescriptions New Prescriptions   OMEPRAZOLE (PRILOSEC) 20 MG CAPSULE    Take 1 capsule (20 mg total) by mouth daily.     Azalia Bilis, MD 07/05/17 1145

## 2017-08-06 ENCOUNTER — Ambulatory Visit: Payer: Self-pay

## 2017-08-12 ENCOUNTER — Ambulatory Visit: Payer: Self-pay

## 2017-08-22 ENCOUNTER — Ambulatory Visit: Payer: Self-pay | Admitting: Family Medicine

## 2017-10-02 ENCOUNTER — Ambulatory Visit: Payer: Self-pay | Attending: Family Medicine | Admitting: Physician Assistant

## 2017-10-02 VITALS — BP 91/61 | HR 75 | Temp 98.7°F | Resp 18 | Ht 59.0 in | Wt 131.0 lb

## 2017-10-02 DIAGNOSIS — M549 Dorsalgia, unspecified: Secondary | ICD-10-CM | POA: Insufficient documentation

## 2017-10-02 DIAGNOSIS — Z87440 Personal history of urinary (tract) infections: Secondary | ICD-10-CM | POA: Insufficient documentation

## 2017-10-02 DIAGNOSIS — N912 Amenorrhea, unspecified: Secondary | ICD-10-CM | POA: Insufficient documentation

## 2017-10-02 DIAGNOSIS — Z79899 Other long term (current) drug therapy: Secondary | ICD-10-CM | POA: Insufficient documentation

## 2017-10-02 DIAGNOSIS — Z8632 Personal history of gestational diabetes: Secondary | ICD-10-CM | POA: Insufficient documentation

## 2017-10-02 LAB — POCT URINALYSIS DIPSTICK
Bilirubin, UA: NEGATIVE
GLUCOSE UA: NEGATIVE
Ketones, UA: NEGATIVE
LEUKOCYTES UA: NEGATIVE
NITRITE UA: NEGATIVE
PROTEIN UA: NEGATIVE
RBC UA: NEGATIVE
SPEC GRAV UA: 1.015 (ref 1.010–1.025)
Urobilinogen, UA: 0.2 E.U./dL
pH, UA: 7 (ref 5.0–8.0)

## 2017-10-02 LAB — POCT URINE PREGNANCY: PREG TEST UR: NEGATIVE

## 2017-10-02 NOTE — Progress Notes (Signed)
Patient ID: Victoria Richmond, female   DOB: 01-03-1986, 31 y.o.   MRN: [id number]   Victoria Richmond, is a 31 y.o. female  [id number]  [id number]  DOB - 01-03-1986  Subjective:  Chief Complaint and HPI: Victoria Richmond is a 31 y.o. female here today for recheck of urine and kidney function.  She had a UTI in 06/2017 that was treated and never had follow-up and wants to do that today.  No urinary symptoms.  Has occasional pinching pain in her back and wants to make sure this isn't her kidneys.  The pain moves to various locations. No OTC meds to help with this bc the pain is only occasional and fleeting.   No f/c.  No abdominal pain. LMP early November, has explanon implant in arm.  No vaginal s/sx.  No pelvic pain.    Ana with [employer] interpreters translating.   ROS:   Constitutional:  No f/c, No night sweats, No unexplained weight loss. EENT:  No vision changes, No blurry vision, No hearing changes. No mouth, throat, or ear problems.  Respiratory: No cough, No SOB Cardiac: No CP, no palpitations GI:  No abd pain, No N/V/D. GU: No Urinary s/sx Musculoskeletal: No joint pain Neuro: No headache, no dizziness, no motor weakness.  Skin: No rash Endocrine:  No polydipsia. No polyuria.  Psych: Denies SI/HI  No problems updated.  ALLERGIES: No Known Allergies  PAST MEDICAL HISTORY: Past Medical History:  Diagnosis Date  . Anemia   . Breast pain, left 12/08/2012   Referred patient to the [facility] [place] for left breast ultrasound. Appointment scheduled for Monday, December 14, 2012 at 0850.  Marland Kitchen. Gestational diabetes     MEDICATIONS AT HOME: Prior to Admission medications   Medication Sig Start Date End Date Taking? Authorizing Provider  acetaminophen (TYLENOL) 500 MG tablet Take 500 mg by mouth every 6 (six) hours as needed for mild pain.   Yes [provider]  omeprazole (PRILOSEC) 20 MG capsule Take 1 capsule (20 mg total) by mouth daily.  07/05/17  Yes Azalia Bilisampos, Kevin, MD  OVER THE COUNTER MEDICATION Take 1 tablet by mouth 2 (two) times daily as needed (acid). Unknown name for indigestion or gastritis   Yes [provider]  ibuprofen (ADVIL,MOTRIN) 800 MG tablet Take 1 tablet (800 mg total) by mouth 3 (three) times daily. Patient not taking: Reported on 07/05/2017 03/11/17   Gilda CreasePollina, Christopher J, MD     Objective:  EXAM:   Vitals:   10/02/17 0937  BP: 91/61  Pulse: 75  Resp: 18  Temp: 98.7 F (37.1 C)  TempSrc: Oral  SpO2: 100%  Weight: 131 lb (59.4 kg)  Height: 4\' 11"  (1.499 m)    General appearance : A&OX3. NAD. Non-toxic-appearing HEENT: Atraumatic and Normocephalic.  PERRLA. EOM intact.  Neck: supple, no JVD. No cervical lymphadenopathy. No thyromegaly Chest/Lungs:  Breathing-non-labored, Good air entry bilaterally, breath sounds normal without rales, rhonchi, or wheezing  CVS: S1 S2 regular, no murmurs, gallops, rubs  No CVA TTP Extremities: Bilateral Lower Ext shows no edema, both legs are warm to touch with = pulse throughout Neurology:  CN II-XII grossly intact, Non focal.   Psych:  TP linear. J/I WNL. Normal speech. Appropriate eye contact and affect.  Skin:  No Rash  Data Review Lab Results  Component Value Date   HGBA1C 5.8 04/24/2017     Assessment & Plan   1. Back pain, unspecified back location, unspecified back pain laterality, unspecified chronicity Urine  is fine. (UTI was 3 months ago) - Urinalysis Dipstick - Basic metabolic panel  2. Amenorrhea - POCT urine pregnancy     Patient have been counseled extensively about nutrition and exercise  Return if symptoms worsen or fail to improve.  The patient was given clear instructions to go to ER or return to medical center if symptoms don't improve, worsen or new problems develop. The patient verbalized understanding. The patient was told to call to get lab results if they haven't heard anything in the next week.     Georgian CoAngela  Zorianna Taliaferro, PA-C [facility] [place], [place] [phone]   10/02/2017, 10:14 AM

## 2017-10-03 LAB — BASIC METABOLIC PANEL
BUN / CREAT RATIO: 17 (ref 9–23)
BUN: 11 mg/dL (ref 6–20)
CO2: 22 mmol/L (ref 20–29)
Calcium: 9.1 mg/dL (ref 8.7–10.2)
Chloride: 104 mmol/L (ref 96–106)
Creatinine, Ser: 0.65 mg/dL (ref 0.57–1.00)
GFR calc Af Amer: 136 mL/min/{1.73_m2} (ref >59)
GFR calc non Af Amer: 118 mL/min/{1.73_m2} (ref >59)
GLUCOSE: 105 mg/dL — AB (ref 65–99)
Potassium: 4.2 mmol/L (ref 3.5–5.2)
SODIUM: 140 mmol/L (ref 134–144)

## 2017-10-06 ENCOUNTER — Telehealth: Payer: Self-pay | Admitting: *Deleted

## 2017-10-06 NOTE — Telephone Encounter (Signed)
Medical Assistant used [employer] to contact patient.  Interpreter Name: Hilma FavorsJose Interpreter #: [phone] Patient was not available, [employer] left patient a voicemail. Voicemail states to give a call back to [place] with [facility] at [phone]. !!!Please inform patient of labs being normal!!!

## 2017-10-06 NOTE — Telephone Encounter (Signed)
-----   Message from Anders SimmondsAngela M McClung, [place] sent at 10/03/2017 10:50 AM EST ----- Please call patient.  Labs are normal.  Thanks, Georgian CoAngela McClung, PA-C

## 2020-06-10 ENCOUNTER — Other Ambulatory Visit: Payer: Self-pay

## 2020-06-10 ENCOUNTER — Inpatient Hospital Stay (HOSPITAL_COMMUNITY)
Admission: AD | Admit: 2020-06-10 | Discharge: 2020-06-10 | Disposition: A | Discharge status: Home / Self Care | Payer: Self-pay | Attending: Obstetrics and Gynecology | Admitting: Obstetrics and Gynecology

## 2020-06-10 ENCOUNTER — Inpatient Hospital Stay (HOSPITAL_COMMUNITY): Payer: Self-pay

## 2020-06-10 ENCOUNTER — Encounter (HOSPITAL_COMMUNITY): Payer: Self-pay | Admitting: Obstetrics and Gynecology

## 2020-06-10 DIAGNOSIS — Z3A08 8 weeks gestation of pregnancy: Secondary | ICD-10-CM | POA: Insufficient documentation

## 2020-06-10 DIAGNOSIS — N939 Abnormal uterine and vaginal bleeding, unspecified: Secondary | ICD-10-CM | POA: Insufficient documentation

## 2020-06-10 DIAGNOSIS — O99891 Other specified diseases and conditions complicating pregnancy: Secondary | ICD-10-CM | POA: Insufficient documentation

## 2020-06-10 DIAGNOSIS — O209 Hemorrhage in early pregnancy, unspecified: Secondary | ICD-10-CM

## 2020-06-10 DIAGNOSIS — R1030 Lower abdominal pain, unspecified: Secondary | ICD-10-CM | POA: Insufficient documentation

## 2020-06-10 LAB — CBC
HCT: 37.1 % (ref 36.0–46.0)
Hemoglobin: 12.6 g/dL (ref 12.0–15.0)
MCH: 30.6 pg (ref 26.0–34.0)
MCHC: 34 g/dL (ref 30.0–36.0)
MCV: 90 fL (ref 80.0–100.0)
Platelets: 252 10*3/uL (ref 150–400)
RBC: 4.12 MIL/uL (ref 3.87–5.11)
RDW: 13.5 % (ref 11.5–15.5)
WBC: 8 10*3/uL (ref 4.0–10.5)
nRBC: 0 % (ref 0.0–0.2)

## 2020-06-10 LAB — URINALYSIS, ROUTINE W REFLEX MICROSCOPIC
Bacteria, UA: NONE SEEN
Bilirubin Urine: NEGATIVE
Glucose, UA: NEGATIVE mg/dL
Ketones, ur: NEGATIVE mg/dL
Leukocytes,Ua: NEGATIVE
Nitrite: NEGATIVE
Protein, ur: NEGATIVE mg/dL
Specific Gravity, Urine: 1.019 (ref 1.005–1.030)
pH: 5 (ref 5.0–8.0)

## 2020-06-10 LAB — POCT PREGNANCY, URINE: Preg Test, Ur: POSITIVE — AB

## 2020-06-10 LAB — WET PREP, GENITAL
Clue Cells Wet Prep HPF POC: NONE SEEN
Sperm: NONE SEEN
Trich, Wet Prep: NONE SEEN
Yeast Wet Prep HPF POC: NONE SEEN

## 2020-06-10 LAB — HCG, QUANTITATIVE, PREGNANCY: hCG, Beta Chain, Quant, S: 195887 m[IU]/mL — ABNORMAL HIGH (ref <5)

## 2020-06-10 NOTE — MAU Note (Signed)
Victoria Richmond is a 34 y.o. here in MAU reporting: had a gush of blood this morning. This afternoon noticed some dark blood when she wiped. Having a little bit of painful lower abdominal pressure.  LMP: 04/09/20  Onset of complaint: today  Pain score: 2/10  Vitals:   06/10/20 1716 06/10/20 1758  BP: 138/71 113/70  Pulse: 68 75  Resp: 14 16  Temp: 99.1 F (37.3 C) 98.4 F (36.9 C)  SpO2: 100% 100%     Lab orders placed from triage: UPT

## 2020-06-10 NOTE — ED Triage Notes (Signed)
Emergency Medicine Provider OB Triage Evaluation Note  Victoria Richmond is a 34 y.o. female, G3P3003, at 9 weeks who presents to the emergency department with complaints of lower abdominal cramping and spotting.  Review of  Systems  Positive: cramping/pelvic pain, bleeding Negative: dizziness, weakness  Physical Exam  BP 138/71 (BP Location: Right Arm)   Pulse 68   Temp 99.1 F (37.3 C) (Oral)   Resp 14   SpO2 100%  General: Awake, no distress  HEENT: Atraumatic  Resp: Normal effort  Cardiac: Normal rate Abd: Nondistended, nontender  MSK: Moves all extremities without difficulty Neuro: Speech clear  Medical Decision Making  Pt evaluated for pregnancy concern and is stable for transfer to MAU. Pt is in agreement with plan for transfer.  5:23 PM Discussed with MAU APP, who accepts patient in transfer.  Clinical Impression   1. Pelvic pain        Jeannie Fend, PA-C 06/10/20 1723

## 2020-06-10 NOTE — Discharge Instructions (Signed)
Hemorragia vaginal durante el embarazo (primer trimestre) °Vaginal Bleeding During Pregnancy, First Trimester ° °Durante los primeros meses del embarazo, es común tener una pequeña hemorragia vaginal (manchado). A veces, la hemorragia es normal y no causa problemas. En otras ocasiones, sin embargo, la hemorragia puede ser una señal de algo grave. Informe a su médico de inmediato si tiene algún tipo de hemorragia vaginal. °Siga estas indicaciones en su casa: °Actividad °· Siga las indicaciones de su médico con respecto al grado de actividad que puede realizar. °· De ser necesario, organícese para que alguien la ayude con las actividades habituales. °· No tenga relaciones sexuales ni orgasmos hasta que el médico le diga que es seguro. °Instrucciones generales °· Tome los medicamentos de venta libre y los recetados solamente como se lo haya indicado el médico. °· Controle su afección para detectar cualquier cambio. °· Escriba los siguientes datos: °? La cantidad de toallas higiénicas que usa cada día. °? La frecuencia con la que se cambia las toallas higiénicas. °? Qué tan empapadas (saturadas) están. °· No use tampones. °· No se haga duchas vaginales. °· Si elimina tejido por la vagina, guárdelo para mostrárselo al médico. °· Concurra a todas las visitas de seguimiento como se lo haya indicado el médico. Esto es importante. °Comuníquese con un médico si: °· Tiene una hemorragia vaginal durante el embarazo. °· Tiene cólicos. °· Tiene fiebre. °Solicite ayuda de inmediato si: °· Siente cólicos muy intensos en la espalda o en el vientre (abdomen). °· Elimina coágulos grandes o mucho tejido por la vagina. °· La hemorragia empeora. °· Siente que va a desvanecerse. °· Se siente débil. °· Pierde el conocimiento (se desmaya). °· Tiene escalofríos. °· Tiene una pérdida de líquido por la vagina. °· Tiene una pérdida de líquido abundante por la vagina. °Resumen °· A veces, la hemorragia vaginal durante el embarazo es normal y no  causa problemas. En otras ocasiones, la hemorragia puede ser una señal de algo grave. °· Informe a su médico de inmediato si tiene algún tipo de hemorragia vaginal. °· Siga las indicaciones de su médico con respecto al grado de actividad que puede realizar. Quizá necesite que alguien la ayude con las actividades habituales. °Esta información no tiene como fin reemplazar el consejo del médico. Asegúrese de hacerle al médico cualquier pregunta que tenga. °Document Revised: 06/19/2017 Document Reviewed: 06/19/2017 °Elsevier Patient Education © 2020 Elsevier Inc. ° °

## 2020-06-10 NOTE — MAU Provider Note (Signed)
None     Chief Complaint:  Abdominal Pain and Vaginal Bleeding   Victoria Richmond is  34 y.o. [id number] at [redacted]w[redacted]d presents complaining of Abdominal Pain and Vaginal Bleeding  Per RN: . here in MAU reporting: had a gush of blood this morning when she was in bed (about 3-5 inches in her underware). This afternoon noticed some dark blood when she wiped. Having a little bit of painful lower abdominal pressure  Obstetrical/Gynecological History: OB History    Gravida  4   Para  3   Term  3   Preterm      AB      Living  3     SAB      TAB      Ectopic      Multiple      Live Births  3          Past Medical History: Past Medical History:  Diagnosis Date  . Anemia   . Breast pain, left 12/08/2012   Referred patient to the [facility] of [facility] for left breast ultrasound. Appointment scheduled for Monday, December 14, 2012 at 0850.  Marland Kitchen Gestational diabetes     Past Surgical History: Past Surgical History:  Procedure Laterality Date  . BLADDER NECK RECONSTRUCTION N/A 01/29/2014   Procedure: Repair of Cystotomy;  Surgeon: Tereso Newcomer, MD;  Location: [facility];  Service: Obstetrics;  Laterality: N/A;  . CESAREAN SECTION    . CESAREAN SECTION N/A 01/29/2014   Procedure: CESAREAN SECTION;  Surgeon: Tereso Newcomer, MD;  Location: [facility];  Service: Obstetrics;  Laterality: N/A;    Family History: Family History  Problem Relation Age of Onset  . Diabetes Paternal Aunt   . Diabetes Mother     Social History: Social History   Tobacco Use  . Smoking status: Never Smoker  . Smokeless tobacco: Never Used  Substance Use Topics  . Alcohol use: No  . Drug use: No    Allergies: No Known Allergies  Meds:  Medications Prior to Admission  Medication Sig Dispense Refill Last Dose  . acetaminophen (TYLENOL) 500 MG tablet Take 500 mg by mouth every 6 (six) hours as needed for mild pain.     Marland Kitchen ibuprofen (ADVIL,MOTRIN) 800 MG tablet Take 1 tablet (800 mg total)  by mouth 3 (three) times daily. (Patient not taking: Reported on 07/05/2017) 21 tablet 0   . omeprazole (PRILOSEC) 20 MG capsule Take 1 capsule (20 mg total) by mouth daily. 30 capsule 0   . OVER THE COUNTER MEDICATION Take 1 tablet by mouth 2 (two) times daily as needed (acid). Unknown name for indigestion or gastritis       Review of Systems   Constitutional: Negative for fever and chills Eyes: Negative for visual disturbances Respiratory: Negative for shortness of breath, dyspnea Cardiovascular: Negative for chest pain or palpitations  Gastrointestinal: Negative for vomiting, diarrhea and constipation Genitourinary: Negative for dysuria and urgency Musculoskeletal: Negative for back pain, joint pain, myalgias.  Normal ROM  Neurological: Negative for dizziness and headaches    Physical Exam  Blood pressure 113/70, pulse 75, temperature 98.4 F (36.9 C), temperature source Oral, resp. rate 16, height 4\' 11"  (1.499 m), weight 60.3 kg, last menstrual period 04/09/2020, SpO2 100 %. GENERAL: Well-developed, well-nourished female in no acute distress.  LUNGS: Normal respiratory effort HEART: Regular rate and rhythm. ABDOMEN: Soft, nontender, nondistended EXTREMITIES: Nontender, no edema, 2+ distal pulses. DTR's 2+ PELVIC:  SSE:  Normal appearing DC,  no blood in vagina, cx non friable CERVICAL EXAM:LTC, firm  Labs: Results for orders placed or performed during the hospital encounter of 06/10/20 (from the past 24 hour(s))  Urinalysis, Routine w reflex microscopic Urine, Clean Catch   Collection Time: 06/10/20  6:06 PM  Result Value Ref Range   Color, Urine YELLOW YELLOW   APPearance HAZY (A) CLEAR   Specific Gravity, Urine 1.019 1.005 - 1.030   pH 5.0 5.0 - 8.0   Glucose, UA NEGATIVE NEGATIVE mg/dL   Hgb urine dipstick SMALL (A) NEGATIVE   Bilirubin Urine NEGATIVE NEGATIVE   Ketones, ur NEGATIVE NEGATIVE mg/dL   Protein, ur NEGATIVE NEGATIVE mg/dL   Nitrite NEGATIVE NEGATIVE    Leukocytes,Ua NEGATIVE NEGATIVE   RBC / HPF 0-5 0 - 5 RBC/hpf   WBC, UA 0-5 0 - 5 WBC/hpf   Bacteria, UA NONE SEEN NONE SEEN   Squamous Epithelial / LPF 0-5 0 - 5   Mucus PRESENT   Pregnancy, urine POC   Collection Time: 06/10/20  6:08 PM  Result Value Ref Range   Preg Test, Ur POSITIVE (A) NEGATIVE  Wet prep, genital   Collection Time: 06/10/20  6:25 PM   Specimen: PATH Cytology Cervicovaginal Ancillary Only  Result Value Ref Range   Yeast Wet Prep HPF POC NONE SEEN NONE SEEN   Trich, Wet Prep NONE SEEN NONE SEEN   Clue Cells Wet Prep HPF POC NONE SEEN NONE SEEN   WBC, Wet Prep HPF POC MODERATE (A) NONE SEEN   Sperm NONE SEEN   CBC   Collection Time: 06/10/20  6:44 PM  Result Value Ref Range   WBC 8.0 4.0 - 10.5 K/uL   RBC 4.12 3.87 - 5.11 MIL/uL   Hemoglobin 12.6 12.0 - 15.0 g/dL   HCT 16.9 36 - 46 %   MCV 90.0 80.0 - 100.0 fL   MCH 30.6 26.0 - 34.0 pg   MCHC 34.0 30.0 - 36.0 g/dL   RDW 67.8 93.8 - 10.1 %   Platelets 252 150 - 400 K/uL   nRBC 0.0 0.0 - 0.2 %    O+ per chart review   Imaging Studies:  Narrative & Impression  CLINICAL DATA:  Bleeding and pain  EXAM: OBSTETRIC <14 WK ULTRASOUND  TECHNIQUE: Transabdominal ultrasound was performed for evaluation of the gestation as well as the maternal uterus and adnexal regions.  COMPARISON:  None.  FINDINGS: Intrauterine gestational sac: Single  Yolk sac:  Visualized.  Embryo:  Visualized.  Cardiac Activity: Visualized.  Heart Rate: 173 bpm  CRL: 26.1  Mm   9 w 3 d                  [place] EDC:   01/10/2021  Subchorionic hemorrhage:  None visualized.  Maternal uterus/adnexae: The right ovary is not well visualized. The left ovary has a small corpus luteum cyst seen. A small subchorionic is noted.  IMPRESSION: Single live intrauterine pregnancy measuring 9 3 days. Small subchorionic hemorrhage.   Electronically Signed   By: Jonna Clark M.D.   On: 06/10/2020 19:40     Assessment: Victoria Richmond is  34 y.o. [id number] at [redacted]w[redacted]d presents with 1st trimester bleeding.  Plan: DC home To start [facility]  Victoria Richmond 9/4/20217:48 PM

## 2020-06-13 LAB — GC/CHLAMYDIA PROBE AMP (~~LOC~~) NOT AT ARMC
Chlamydia: NEGATIVE
Comment: NEGATIVE
Comment: NORMAL
Neisseria Gonorrhea: NEGATIVE

## 2020-06-24 ENCOUNTER — Ambulatory Visit (HOSPITAL_COMMUNITY)
Admission: EM | Admit: 2020-06-24 | Discharge: 2020-06-24 | Disposition: A | Discharge status: Home / Self Care | Payer: Self-pay

## 2020-06-24 ENCOUNTER — Encounter (HOSPITAL_COMMUNITY): Payer: Self-pay | Admitting: Emergency Medicine

## 2020-06-24 ENCOUNTER — Other Ambulatory Visit: Payer: Self-pay

## 2020-06-24 DIAGNOSIS — T63441A Toxic effect of venom of bees, accidental (unintentional), initial encounter: Secondary | ICD-10-CM

## 2020-06-24 MED ORDER — CETIRIZINE HCL 10 MG PO CAPS: 10.0000 mg | ORAL_CAPSULE | Freq: Every day | ORAL | 0 refills | Status: DC

## 2020-06-24 MED ORDER — TRIAMCINOLONE ACETONIDE 0.1 % EX CREA: 1.0000 "application " | TOPICAL_CREAM | Freq: Two times a day (BID) | CUTANEOUS | 0 refills | Status: DC

## 2020-06-24 NOTE — Discharge Instructions (Signed)
[place] la crema 2 veces cada dia Cetirizine diaria [place] helo Tylenol para dolor Regrese si no mejoran o empeoran

## 2020-06-24 NOTE — ED Triage Notes (Signed)
Patient has a bee sting from yesterday on the back of left leg.  Today site is swollen, painful and irritated and itches.    Denies breathing issues, no throat issues.    Patient is pregnant.EDD January 14, 2021

## 2020-06-24 NOTE — ED Provider Notes (Signed)
[facility]    CSN: [id number] Arrival date & time: 06/24/20  1006      History   Chief Complaint Chief Complaint  Patient presents with   Insect Bite    HPI Xoie Kreuser is a 34 y.o. female [redacted] weeks pregnant presenting today for evaluation of bug bite/sting.  Reports being stung by wasp yesterday.  Felt this happened and saw the wasp afterward.  Since she has had area of swelling pain and itching to the back of her left leg.  Reports pain with walking.  Denies any difficulty breathing or shortness of breath.  Denies oral swelling.  HPI  Past Medical History:  Diagnosis Date   Anemia    Breast pain, left 12/08/2012   Referred patient to the [facility] for left breast ultrasound. Appointment scheduled for Monday, December 14, 2012 at 0850.   Gestational diabetes     Patient Active Problem List   Diagnosis Date Noted   Vaginal bleeding in pregnancy, first trimester 06/10/2020   Bladder laceration as postoperative complication 01/30/2014   S/P cesarean section 01/29/2014   Supervision of other high-risk pregnancy 12/13/2013   Previous cesarean delivery affecting pregnancy, antepartum 12/13/2013   Gestational diabetes mellitus, antepartum 12/13/2013   Breast pain, left 12/08/2012    Past Surgical History:  Procedure Laterality Date   BLADDER NECK RECONSTRUCTION N/A 01/29/2014   Procedure: Repair of Cystotomy;  Surgeon: Tereso Newcomer, MD;  Location: [facility];  Service: Obstetrics;  Laterality: N/A;   CESAREAN SECTION     CESAREAN SECTION N/A 01/29/2014   Procedure: CESAREAN SECTION;  Surgeon: Tereso Newcomer, MD;  Location: [facility];  Service: Obstetrics;  Laterality: N/A;    OB History    Gravida  4   Para  3   Term  3   Preterm      AB      Living  3     SAB      TAB      Ectopic      Multiple      Live Births  3            Home Medications    Prior to Admission medications   Medication Sig  Start Date End Date Taking? Authorizing Provider  Prenatal Vit-Fe Fumarate-FA (PRENATAL VITAMINS PO) Take by mouth.   Yes [provider]  acetaminophen (TYLENOL) 500 MG tablet Take 500 mg by mouth every 6 (six) hours as needed for mild pain.    [provider]  Cetirizine HCl 10 MG CAPS Take 1 capsule (10 mg total) by mouth daily for 10 days. 06/24/20 07/04/20  Abisai Deer C, PA-C  triamcinolone cream (KENALOG) 0.1 % Apply 1 application topically 2 (two) times daily. 06/24/20   Daesha Insco, Junius Creamer, PA-C    Family History Family History  Problem Relation Age of Onset   Diabetes Paternal Aunt    Diabetes Mother     Social History Social History   Tobacco Use   Smoking status: Never Smoker   Smokeless tobacco: Never Used  Substance Use Topics   Alcohol use: No   Drug use: No     Allergies   Patient has no known allergies.   Review of Systems Review of Systems  Constitutional: Negative for fatigue and fever.  HENT: Negative for mouth sores.   Eyes: Negative for visual disturbance.  Respiratory: Negative for shortness of breath.   Cardiovascular: Negative for chest pain.  Gastrointestinal: Negative  for abdominal pain, nausea and vomiting.  Genitourinary: Negative for genital sores.  Musculoskeletal: Negative for arthralgias and joint swelling.  Skin: Positive for color change and rash. Negative for wound.  Neurological: Negative for dizziness, weakness, light-headedness and headaches.     Physical Exam Triage Vital Signs ED Triage Vitals  Enc Vitals Group     BP      Pulse      Resp      Temp      Temp src      SpO2      Weight      Height      Head Circumference      Peak Flow      Pain Score      Pain Loc      Pain Edu?      Excl. in GC?    No data found.  Updated Vital Signs BP 106/74 (BP Location: Right Arm)    Pulse 83    Temp 98.1 F (36.7 C) (Oral)    Resp 18    LMP 04/09/2020    SpO2 99%   Visual Acuity Right Eye  Distance:   Left Eye Distance:   Bilateral Distance:    Right Eye Near:   Left Eye Near:    Bilateral Near:     Physical Exam Vitals and nursing note reviewed.  Constitutional:      Appearance: She is well-developed.     Comments: No acute distress  HENT:     Head: Normocephalic and atraumatic.     Nose: Nose normal.     Mouth/Throat:     Comments: Oral mucosa pink and moist, no tonsillar enlargement or exudate. Posterior pharynx patent and nonerythematous, no uvula deviation or swelling. Normal phonation. Eyes:     Conjunctiva/sclera: Conjunctivae normal.  Cardiovascular:     Rate and Rhythm: Normal rate.  Pulmonary:     Effort: Pulmonary effort is normal. No respiratory distress.  Abdominal:     General: There is no distension.  Musculoskeletal:        General: Normal range of motion.     Cervical back: Neck supple.  Skin:    General: Skin is warm and dry.     Comments: Large plaque noted to posterior thigh of erythema and warmth and swelling with central punctate lesion, no induration or fluctuance  Neurological:     Mental Status: She is alert and oriented to person, place, and time.      UC Treatments / Results  Labs (all labs ordered are listed, but only abnormal results are displayed) Labs Reviewed - No data to display  EKG   Radiology No results found.  Procedures Procedures (including critical care time)  Medications Ordered in UC Medications - No data to display  Initial Impression / Assessment and Plan / UC Course  I have reviewed the triage vital signs and the nursing notes.  Pertinent labs & imaging results that were available during my care of the patient were reviewed by me and considered in my medical decision making (see chart for details).     Localized swelling and inflammation around sting, no airway involvement.  Do not suspect cellulitis or infection at this time.  Recommending anti-inflammatories and triamcinolone cream topically.   [redacted] weeks pregnant, deferring any oral steroids.  Tylenol for pain and ice.  Discussed strict return precautions. Patient verbalized understanding and is agreeable with plan.  Final Clinical Impressions(s) / UC Diagnoses   Final diagnoses:  Bee sting reaction, accidental or unintentional, initial encounter     Discharge Instructions     [place] la crema 2 veces cada dia Cetirizine diaria [place] helo Tylenol para dolor Regrese si no mejoran o empeoran    ED Prescriptions    Medication Sig Dispense Auth. Provider   triamcinolone cream (KENALOG) 0.1 % Apply 1 application topically 2 (two) times daily. 45 g Fender Herder C, PA-C   Cetirizine HCl 10 MG CAPS Take 1 capsule (10 mg total) by mouth daily for 10 days. 10 capsule Renaye Janicki, [place] C, PA-C     PDMP not reviewed this encounter.   Lew Dawes, PA-C 06/24/20 1053

## 2020-06-27 LAB — OB RESULTS CONSOLE GC/CHLAMYDIA
Chlamydia: NEGATIVE
Gonorrhea: NEGATIVE

## 2020-06-27 LAB — OB RESULTS CONSOLE HGB/HCT, BLOOD: Hemoglobin: 11.6

## 2020-07-03 LAB — OB RESULTS CONSOLE RPR: RPR: NONREACTIVE

## 2020-07-03 LAB — OB RESULTS CONSOLE VARICELLA ZOSTER ANTIBODY, IGG: Varicella: IMMUNE

## 2020-07-03 LAB — OB RESULTS CONSOLE RUBELLA ANTIBODY, IGM: Rubella: IMMUNE

## 2020-07-03 LAB — OB RESULTS CONSOLE ABO/RH: RH Type: POSITIVE

## 2020-07-03 LAB — SICKLE CELL SCREEN: Sickle Cell Screen: NORMAL

## 2020-07-03 LAB — OB RESULTS CONSOLE HEPATITIS B SURFACE ANTIGEN: Hepatitis B Surface Ag: NEGATIVE

## 2020-07-03 LAB — OB RESULTS CONSOLE ANTIBODY SCREEN: Antibody Screen: NEGATIVE

## 2020-07-03 LAB — OB RESULTS CONSOLE HIV ANTIBODY (ROUTINE TESTING): HIV: NONREACTIVE

## 2020-07-03 LAB — HEPATITIS C ANTIBODY: HCV Ab: NEGATIVE

## 2020-07-07 ENCOUNTER — Other Ambulatory Visit: Payer: Self-pay | Admitting: Family

## 2020-07-07 DIAGNOSIS — Z369 Encounter for antenatal screening, unspecified: Secondary | ICD-10-CM

## 2020-07-13 ENCOUNTER — Other Ambulatory Visit: Payer: Self-pay

## 2020-07-13 ENCOUNTER — Ambulatory Visit: Payer: Self-pay | Attending: Family

## 2020-07-13 ENCOUNTER — Ambulatory Visit: Payer: Self-pay | Admitting: Obstetrics and Gynecology

## 2020-07-13 ENCOUNTER — Ambulatory Visit: Payer: Self-pay | Admitting: *Deleted

## 2020-07-13 ENCOUNTER — Other Ambulatory Visit: Payer: Self-pay | Admitting: *Deleted

## 2020-07-13 ENCOUNTER — Other Ambulatory Visit: Payer: Self-pay | Admitting: Family

## 2020-07-13 ENCOUNTER — Ambulatory Visit: Payer: Self-pay

## 2020-07-13 ENCOUNTER — Ambulatory Visit (HOSPITAL_BASED_OUTPATIENT_CLINIC_OR_DEPARTMENT_OTHER): Payer: Self-pay

## 2020-07-13 DIAGNOSIS — Z369 Encounter for antenatal screening, unspecified: Secondary | ICD-10-CM | POA: Insufficient documentation

## 2020-07-13 DIAGNOSIS — O209 Hemorrhage in early pregnancy, unspecified: Secondary | ICD-10-CM

## 2020-07-13 DIAGNOSIS — Z3689 Encounter for other specified antenatal screening: Secondary | ICD-10-CM

## 2020-07-13 DIAGNOSIS — O09521 Supervision of elderly multigravida, first trimester: Secondary | ICD-10-CM | POA: Insufficient documentation

## 2020-07-13 DIAGNOSIS — Z3A13 13 weeks gestation of pregnancy: Secondary | ICD-10-CM

## 2020-07-13 LAB — CYSTIC FIBROSIS DIAGNOSTIC STUDY: Interpretation-CFDNA:: NEGATIVE

## 2020-07-13 NOTE — Progress Notes (Signed)
Referring Provider:  Donato Schultz Length of Consultation: 40 minutes  Victoria Richmond was referred to [facility] for genetic counseling because of advanced maternal age.  The patient will be 34 years old at the time of delivery.  This note summarizes the information we discussed with the aid of a Spanish interpreter.    We explained that the chance of a chromosome abnormality increases with maternal age.  Chromosomes and examples of chromosome problems were reviewed.  Humans typically have 46 chromosomes in each cell, with half passed through each sperm and egg.  Any change in the number or structure of chromosomes can increase the risk of problems in the physical and mental development of a pregnancy.   Based upon age of the patient, the chance of any chromosome abnormality was 1 in 47. The chance of Down syndrome, the most common chromosome problem associated with maternal age, was 1 in 50.  The risk of chromosome problems is in addition to the 3% general population risk for birth defects and intellectual disabilities.  The greatest chance, of course, is that the baby would be born in good health.  We discussed the following prenatal screening and testing options for this pregnancy:  Cell free fetal DNA testing analyzes maternal blood to determine whether or not the baby may have Down syndrome, trisomy 83, or trisomy 5.  This test utilizes a maternal blood sample and DNA sequencing technology to isolate circulating cell free fetal DNA from maternal plasma.  The fetal DNA can then be analyzed for DNA sequences that are derived from the three most common chromosomes involved in aneuploidy, chromosomes 13, 18, and 21.  If the overall amount of DNA is greater than the expected level for any of these chromosomes, aneuploidy is suspected.  While we do not consider it a replacement for invasive testing and karyotype analysis, a negative result from this testing would be reassuring,  though not a guarantee of a normal chromosome complement for the baby.  An abnormal result is certainly suggestive of an abnormal chromosome complement, though we would still recommend CVS or amniocentesis to confirm any findings from this testing.  First trimester screening, is another blood test which includes nuchal translucency ultrasound screen and first trimester maternal serum marker screening.  The nuchal translucency has approximately an 80% detection rate for Down syndrome and can be positive for other chromosome abnormalities as well as heart defects.  When combined with a maternal serum marker screening, the detection rate is up to 90% for Down syndrome and up to 97% for trisomy 18.     The chorionic villus sampling procedure is available for first trimester chromosome analysis.  This involves the withdrawal of a small amount of chorionic villi (tissue from the developing placenta).  Risk of pregnancy loss is estimated to be approximately 1 in 200 to 1 in 100 (0.5 to 1%).  There is approximately a 1% (1 in 100) chance that the CVS chromosome results will be unclear.  Chorionic villi cannot be tested for neural tube defects.     Maternal serum marker screening, a blood test that measures pregnancy proteins, can provide risk assessments for Down syndrome, trisomy 18, and open neural tube defects (spina bifida, anencephaly). Because it does not directly examine the fetus, it cannot positively diagnose or rule out these problems. The detection rate is approximately 75% for Down syndrome, 70% for trisomy 18 and 80% of open neural tube defects. If prior chromosome screening has been performed,  then AFP only is recommended to test for open neural tube defects alone.  Targeted ultrasound uses high frequency sound waves to create an image of the developing fetus.  An ultrasound is often recommended as a routine means of evaluating the pregnancy.  It is also used to screen for fetal anatomy problems (for  example, a heart defect) that might be suggestive of a chromosomal or other abnormality.   Amniocentesis involves the removal of a small amount of amniotic fluid from the sac surrounding the fetus with the use of a thin needle inserted through the maternal abdomen and uterus.  Ultrasound guidance is used throughout the procedure.  Fetal cells from amniotic fluid are directly evaluated and > 99.5% of chromosome problems and > 98% of open neural tube defects can be detected. This procedure is generally performed after the 15th week of pregnancy.  The main risks to this procedure include complications leading to miscarriage in less than 1 in 200 cases (0.5%).  Cystic Fibrosis and Spinal Muscular Atrophy (SMA) screening were also discussed with the patient. Both conditions are recessive, which means that both parents must be carriers in order to have a child with the disease.  Cystic fibrosis (CF) is one of the most common genetic conditions in persons of Caucasian ancestry.  This condition occurs in approximately 1 in 2,500 Caucasian persons and results in thickened secretions in the lungs, digestive, and reproductive systems.  For a baby to be at risk for having CF, both of the parents must be carriers for this condition.  Approximately 1 in 32 Caucasian persons is a carrier for CF.  Current carrier testing looks for the most common mutations in the gene for CF and can detect approximately 90% of carriers in the Caucasian population.  This means that the carrier screening can greatly reduce, but cannot eliminate, the chance for an individual to have a child with CF.  If an individual is found to be a carrier for CF, then carrier testing would be available for the partner. As part of [place]'s newborn screening profile, all babies born in the state of [place] will have a two-tier screening process.  Specimens are first tested to determine the concentration of immunoreactive trypsinogen (IRT).  The top  5% of specimens with the highest IRT values then undergo DNA testing using a panel of over 40 common CF mutations. SMA is a neurodegenerative disorder that leads to atrophy of skeletal muscle and overall weakness.  This condition is also more prevalent in the Caucasian population, with 1 in 40-1 in 60 persons being a carrier and 1 in 6,000-1 in 10,000 children being affected.  There are multiple forms of the disease, with some causing death in infancy to other forms with survival into adulthood.  The genetics of SMA is complex, but carrier screening can detect up to 95% of carriers in the Caucasian population.  Similar to CF, a negative result can greatly reduce, but cannot eliminate, the chance to have a child with SMA.  Hemoglobinopathy screening was also offered to the patient.  We obtained a detailed family history and pregnancy history.  The family history is unremarkable for birth defects, developmental delays, recurrent pregnancy loss or known chromosome abnormalities.  [name] stated that this is the fourth pregnancy for she and her partner.  They have three healthy daughters, ages 3, 63 and 6 years.  She reported no complications or exposures in this pregnancy that would be expected to increase the risk for  birth defects.  After consideration of the options, Victoria Richmond elected to decline all screening and testing for chromosome conditions and carrier status.  She would like to proceed with an ultrasound in the second trimester for anatomy. This was scheduled here at [facility] in 6 weeks, however, the patient expressed that her Presumptive Medicaid expires on 08/06/20.  She will only be 16 weeks at that time and therefore early for the anatomy ultrasound.  We encouraged her to speak with her OB and if she does not have coverage, then she may choose to have the ultrasound at [facility] with a plan to return to our clinic as needed based upon their discretion.  An ultrasound was performed at the  time of the visit.  The gestational age was consistent with 13 weeks.  Fetal anatomy could not be assessed due to early gestational age.  Please refer to the ultrasound report for details of that study.  [name] was encouraged to call with questions or concerns.  We can be contacted at [phone].   Tests Ordered: none - declined  Cherly Anderson, MS, CGC

## 2020-08-24 ENCOUNTER — Ambulatory Visit: Payer: Self-pay

## 2020-10-12 ENCOUNTER — Encounter: Payer: Self-pay | Admitting: Obstetrics & Gynecology

## 2020-10-26 ENCOUNTER — Encounter: Payer: Self-pay | Admitting: Obstetrics & Gynecology

## 2020-10-26 ENCOUNTER — Other Ambulatory Visit: Payer: Self-pay

## 2020-10-26 ENCOUNTER — Ambulatory Visit (INDEPENDENT_AMBULATORY_CARE_PROVIDER_SITE_OTHER): Payer: Self-pay | Admitting: Obstetrics & Gynecology

## 2020-10-26 VITALS — BP 102/63 | HR 86 | Wt 155.2 lb

## 2020-10-26 DIAGNOSIS — N736 Female pelvic peritoneal adhesions (postinfective): Secondary | ICD-10-CM

## 2020-10-26 DIAGNOSIS — O0990 Supervision of high risk pregnancy, unspecified, unspecified trimester: Secondary | ICD-10-CM | POA: Insufficient documentation

## 2020-10-26 DIAGNOSIS — Z8632 Personal history of gestational diabetes: Secondary | ICD-10-CM

## 2020-10-26 DIAGNOSIS — O09299 Supervision of pregnancy with other poor reproductive or obstetric history, unspecified trimester: Secondary | ICD-10-CM

## 2020-10-26 DIAGNOSIS — Z98891 History of uterine scar from previous surgery: Secondary | ICD-10-CM

## 2020-10-26 DIAGNOSIS — Z3A28 28 weeks gestation of pregnancy: Secondary | ICD-10-CM

## 2020-10-26 NOTE — Progress Notes (Signed)
History:   Victoria Richmond is a 35 y.o. [id number] at [redacted]w[redacted]d by LMP being seen today for her first obstetrical visit.  Her obstetrical history is significant for advanced maternal age, h/o gestational diabetes x 3, h/o macrosomia with first pregnancy (10#, 1oz), h/o cesarean section x 3.  H/o complicated cesarean section in 2015 with bladder injury and cystotomy repair due to significant pelvic adhesive disease.    Pt intends to breast and bottle feed.  She plans to go back to work so will needs to do both.  Pregnancy history fully reviewed.    Patient reports no complaints.  Has good FM.  Denies VB or leakage of fluid.     HISTORY: OB History  Gravida Para Term Preterm AB Living  4 3 3  0 0 3  SAB IAB Ectopic Multiple Live Births  0 0 0 0 3    # Outcome Date GA Lbr Len/2nd Weight Sex Delivery Anes PTL Lv  4 Current           3 Term 01/29/14 [redacted]w[redacted]d  9 lb 4.3 oz (4.204 kg) F CS-Vac Spinal  LIV     Name: CAMPOS-GODINEZ,GIRL Burnie     Apgar1: 9  Apgar5: 9  2 Term 04/08/11 [redacted]w[redacted]d  8 lb (3.629 kg) F CS-LTranv   LIV     Birth Comments: GDM  1 Term 08/29/07 [redacted]w[redacted]d  10 lb 1 oz (4.564 kg) F CS-LTranv  N LIV     Birth Comments: Macrosomia, GDM    Last pap smear was done 02/14/2020 and was normal with neg HR HPV  Past Medical History:  Diagnosis Date  . Anemia   . Breast pain, left 12/08/2012   Referred patient to the [facility] for left breast ultrasound. Appointment scheduled for Monday, December 14, 2012 at 0850.  December 16, 2012 Gestational diabetes   . Pyelonephritis   . Supervision of other high-risk pregnancy 12/13/2013    Clinic  [facility]  Dating EDC by LMP consistent with 18 week ultrasound  Genetic Screen  Quad: negative                  Anatomic 02/12/2014  Normal  GTT GDM  TDaP vaccine  received  Flu vaccine  received  GBS   Baby Food  breast  Contraception  Mirena vs paragarud  Circumcision  female  Pediatrician   Support Person  partner       Past Surgical History:  Procedure Laterality  Date  . BLADDER NECK RECONSTRUCTION N/A 01/29/2014   Procedure: Repair of Cystotomy;  Surgeon: 01/31/2014, MD;  Location: [facility];  Service: Obstetrics;  Laterality: N/A;  . CESAREAN SECTION    . CESAREAN SECTION N/A 01/29/2014   Procedure: CESAREAN SECTION;  Surgeon: 01/31/2014, MD;  Location: [facility];  Service: Obstetrics;  Laterality: N/A;   Family History  Problem Relation Age of Onset  . Diabetes Paternal Aunt   . Diabetes Mother    Social History   Tobacco Use  . Smoking status: Never Smoker  . Smokeless tobacco: Never Used  Vaping Use  . Vaping Use: Never used  Substance Use Topics  . Alcohol use: No  . Drug use: No   No Known Allergies Current Outpatient Medications on File Prior to Visit  Medication Sig Dispense Refill  . Cetirizine HCl 10 MG CAPS Take 1 capsule (10 mg total) by mouth daily for 10 days. 10 capsule 0  . Prenatal Vit-Fe Fumarate-FA (PRENATAL VITAMINS PO)  Take by mouth.     No current facility-administered medications on file prior to visit.    Review of Systems Pertinent items noted in HPI and remainder of comprehensive ROS otherwise negative.  Physical Exam:   Vitals:   10/26/20 0845  BP: 102/63  Pulse: 86  Weight: 155 lb 3.2 oz (70.4 kg)   Fetal Heart Rate (bpm): 154   Patient informed that the ultrasound is considered a limited obstetric ultrasound and is not intended to be a complete ultrasound exam.  Patient also informed that the ultrasound is not being completed with the intent of assessing for fetal or placental anomalies or any pelvic abnormalities.  Explained that the purpose of today's ultrasound is to assess for fetal heart rate.  Patient acknowledges the purpose of the exam and the limitations of the study. General: well-developed, well-nourished female in no acute distress     Skin: normal coloration and turgor, no rashes  Neurologic: oriented, normal, negative, normal mood  Extremities: normal strength, tone, and muscle  mass, ROM of all joints is normal  HEENT PERRLA, extraocular movement intact and sclera clear, anicteric  Neck supple and no masses  Cardiovascular: regular rate and rhythm  Respiratory:  no respiratory distress, normal breath sounds  Abdomen: soft, non-tender; bowel sounds normal; no masses,  no organomegaly  Pelvic: normal external genitalia, no lesions, normal vaginal mucosa, normal vaginal discharge, normal cervix. Uterine size:   28 cm    Assessment:    Pregnancy: [id number] Patient Active Problem List   Diagnosis Date Noted  . Supervision of high risk pregnancy, antepartum 10/26/2020  . Vaginal bleeding in pregnancy, first trimester 06/10/2020  . History of gestational diabetes mellitus 04/23/2016  . Bladder laceration as postoperative complication 01/30/2014  . Previous cesarean delivery affecting pregnancy, antepartum 12/13/2013  . Breast pain, left 12/08/2012     Plan:    1. [redacted] weeks gestation of pregnancy - continue PNV - recheck 3 weeks - will obtained anatomy scan - Plan Tdap at next visit.  Received second Covid vaccination last week.  Feel ok to hold on tdap today.  2. Supervision of high risk pregnancy, antepartum - pt not fasting today.  Will return for 28 weeks labs. - CBC; Future - Glucose Tolerance, 2 Hours w/1 Hour; Future - HIV Antibody (routine testing w rflx); Future - RPR; Future  3. History of gestational diabetes mellitus - pt is already being careful with diet  4. History of cesarean delivery - pt aware needs repeat cesarean section with vertical skin and uterine incision.  She desires BTL as well.   - Delivery around 38 weeks discussed.  Pt was scheduled for repeat cesarean section at 39 weeks with 2015 pregnancy and went into labor.  Had complicated c section with bladder injury.  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 History of macrosomia in infant in prior pregnancy, currently pregnant  Continue prenatal vitamins. Problem list reviewed  and updated. Genetic Screening discussed.  Quad screen negative at [facility] Ultrasound discussed; done with [facility] per pt.  Will obtained records.  Anticipatory guidance about prenatal visits given including labs, ultrasounds, and testing. Discussed usage of Babyscripts and virtual visits as additional source of managing and completing prenatal visits in midst of coronavirus and pandemic.   Encouraged to complete MyChart Registration for her ability to review results, send requests, and have questions addressed.  The nature of [facility] Practice with multiple MDs and Advanced Practice Providers was explained  to patient; also emphasized that residents, students are part of our team. Routine obstetric precautions reviewed. Encouraged to seek out care at office or emergency room [facility] preferred) for urgent and/or emergent concerns.  Return in about 3 weeks (around 11/16/2020) for Office Ob visit (MD only), Tdap.     Lum Keas, MD, FACOG Obstetrician & Gynecologist, [facility] for [facility], [facility]

## 2020-11-10 ENCOUNTER — Other Ambulatory Visit: Payer: Self-pay | Admitting: Obstetrics & Gynecology

## 2020-11-10 ENCOUNTER — Other Ambulatory Visit: Payer: Self-pay

## 2020-11-10 DIAGNOSIS — O0990 Supervision of high risk pregnancy, unspecified, unspecified trimester: Secondary | ICD-10-CM

## 2020-11-15 LAB — RPR, QUANT+TP ABS (REFLEX)
Rapid Plasma Reagin, Quant: 1:1 {titer} — ABNORMAL HIGH
T Pallidum Abs: NONREACTIVE

## 2020-11-15 LAB — CBC
Hematocrit: 34.8 % (ref 34.0–46.6)
Hemoglobin: 11.5 g/dL (ref 11.1–15.9)
MCH: 29.6 pg (ref 26.6–33.0)
MCHC: 33 g/dL (ref 31.5–35.7)
MCV: 90 fL (ref 79–97)
Platelets: 219 10*3/uL (ref 150–450)
RBC: 3.89 x10E6/uL (ref 3.77–5.28)
RDW: 13.8 % (ref 11.7–15.4)
WBC: 7.4 10*3/uL (ref 3.4–10.8)

## 2020-11-15 LAB — RPR: RPR Ser Ql: REACTIVE — AB

## 2020-11-15 LAB — GLUCOSE TOLERANCE, 2 HOURS W/ 1HR
Glucose, 1 hour: 260 mg/dL — ABNORMAL HIGH (ref 65–179)
Glucose, 2 hour: 277 mg/dL — ABNORMAL HIGH (ref 65–152)
Glucose, Fasting: 119 mg/dL — ABNORMAL HIGH (ref 65–91)

## 2020-11-15 LAB — HIV ANTIBODY (ROUTINE TESTING W REFLEX): HIV Screen 4th Generation wRfx: NONREACTIVE

## 2020-11-16 ENCOUNTER — Other Ambulatory Visit: Payer: Self-pay

## 2020-11-16 ENCOUNTER — Ambulatory Visit (INDEPENDENT_AMBULATORY_CARE_PROVIDER_SITE_OTHER): Payer: Self-pay | Admitting: Obstetrics & Gynecology

## 2020-11-16 VITALS — BP 121/68 | HR 102 | Wt 160.5 lb

## 2020-11-16 DIAGNOSIS — O24419 Gestational diabetes mellitus in pregnancy, unspecified control: Secondary | ICD-10-CM

## 2020-11-16 DIAGNOSIS — O0990 Supervision of high risk pregnancy, unspecified, unspecified trimester: Secondary | ICD-10-CM

## 2020-11-16 DIAGNOSIS — Z23 Encounter for immunization: Secondary | ICD-10-CM

## 2020-11-16 DIAGNOSIS — O34219 Maternal care for unspecified type scar from previous cesarean delivery: Secondary | ICD-10-CM

## 2020-11-16 MED ORDER — METFORMIN HCL 500 MG PO TABS: 500.0000 mg | ORAL_TABLET | Freq: Two times a day (BID) | ORAL | 2 refills | Status: DC

## 2020-11-16 NOTE — Progress Notes (Signed)
   PRENATAL VISIT NOTE  Subjective:  Victoria Richmond is a 35 y.o. G4P3003 at [redacted]w[redacted]d being seen today for ongoing prenatal care.  She is currently monitored for the following issues for this high-risk pregnancy and has Breast pain, left; Previous cesarean delivery affecting pregnancy, antepartum; Gestational diabetes; Bladder laceration as postoperative complication; Vaginal bleeding in pregnancy, first trimester; History of gestational diabetes mellitus; and Supervision of high risk pregnancy, antepartum on their problem list.  Patient reports no complaints.  Contractions: Not present. Vag. Bleeding: None.  Movement: Present. Denies leaking of fluid.   The following portions of the patient's history were reviewed and updated as appropriate: allergies, current medications, past family history, past medical history, past social history, past surgical history and problem list.   Objective:   Vitals:   11/16/20 0829  BP: 121/68  Pulse: (!) 102  Weight: 160 lb 8 oz (72.8 kg)    Fetal Status: Fetal Heart Rate (bpm): 135   Movement: Present     General:  Alert, oriented and cooperative. Patient is in no acute distress.  Skin: Skin is warm and dry. No rash noted.   Cardiovascular: Normal heart rate noted  Respiratory: Normal respiratory effort, no problems with respiration noted  Abdomen: Soft, gravid, appropriate for gestational age.  Pain/Pressure: Present     Pelvic: Cervical exam deferred        Extremities: Normal range of motion.  Edema: None  Mental Status: Normal mood and affect. Normal behavior. Normal judgment and thought content.   Assessment and Plan:  Pregnancy: G4P3003 at [redacted]w[redacted]d 1. Previous cesarean delivery affecting pregnancy, antepartum Needs [place] and start prenatal testing soon - [place] MFM OB DETAIL +14 WK; Future  2. Gestational diabetes mellitus (GDM) in third trimester, gestational diabetes method of control unspecified High BG and failed pp 2 hr in 2015 but was told  she was not diabetic - Referral to Nutrition and Diabetes Services - metFORMIN (GLUCOPHAGE) 500 MG tablet; Take 1 tablet (500 mg total) by mouth 2 (two) times daily with a meal.  Dispense: 60 tablet; Refill: 2 - [place] MFM OB DETAIL +14 WK; Future  3. Supervision of high risk pregnancy, antepartum  - Tdap vaccine greater than or equal to 7yo IM  Preterm labor symptoms and general obstetric precautions including but not limited to vaginal bleeding, contractions, leaking of fluid and fetal movement were reviewed in detail with the patient. Please refer to After Visit Summary for other counseling recommendations.   Return in about 1 week (around 11/23/2020).  No future appointments.  Scheryl Darter, MD

## 2020-11-16 NOTE — Patient Instructions (Signed)
Diabetes mellitus gestacional, diagnstico Gestational Diabetes Mellitus, Diagnosis La diabetes mellitus gestacional es una forma de diabetes. Puede manifestarse mientras est embarazada. La diabetes desaparece despus de dar a luz. Si no recibe tratamiento, esta afeccin puede causarles problemas a usted y a su beb. Cules son las causas? La causa de esta afeccin son cambios que ocurren en su cuerpo cuando est embarazada. Cuando esos cambios suceden:  Un rgano del cuerpo llamado pncreas no produce suficiente insulina.  El cuerpo no puede utilizar la insulina de la manera correcta. El azcar no puede entrar en las clulas del cuerpo. El azcar permanece en la sangre. Esto provoca un nivel alto de azcar en la sangre.   Qu incrementa el riesgo?  Ser mayor de 25 aos durante el embarazo.  Tener a alguien con diabetes en la familia.  Exceso de peso corporal.  Haber tenido esta afeccin antes.  Sndrome del ovario poliqustico.  Estar embarazada de ms de un beb. Cules son los signos o sntomas?  Tener sed con frecuencia.  Tener hambre con frecuencia.  Tener necesidad de orinar con mayor frecuencia. Cmo se trata?  Siga una dieta saludable.  Haga ms ejercicio.  Controle su nivel de azcar en la sangre con frecuencia.  Aplquese insulina o tome otros medicamentos, si es necesario.  Trabaje con un experto en esta afeccin, si se lo recomiendan. Siga estas instrucciones en su casa: Infrmese sobre la diabetes Pregntele al mdico lo siguiente:  Con qu frecuencia debo controlarme el azcar en la sangre? Dnde obtengo el equipo?  Qu medicamentos necesito? Cundo debo tomarlos?  Es necesario que me rena con un instructor?  A quin puedo llamar si tengo preguntas?  Dnde puedo encontrar un grupo de apoyo? Indicaciones generales  Use los medicamentos solamente como se lo haya indicado el mdico.  Mantenga un peso saludable.  Beba suficiente  lquido como para mantener la orina de color amarillo plido.  Use una brazalete de alerta o lleve una tarjeta que muestre que tiene esta afeccin.  Cumpla con todas las visitas de seguimiento. Dnde buscar ms informacin  American Diabetes Association (ADA) (Asociacin Estadounidense de la Diabetes): diabetes.org  Association of Diabetes Care & Education Specialists (ADCES) (Asociacin de Especialistas en Atencin y Educacin sobre la Diabetes): diabeteseducator.org  Centers for Disease Control and Prevention (Centros para el Control y la Prevencin de Enfermedades, CDC): cdc.gov  American Pregnancy Association (Asociacin Americana del Embarazo): americanpregnancy.org  [place] Department of Agriculture MyPlate (MyPlate del Departamento de Agricultura de los [place]): myplate.gov Comunquese con un mdico si:  Su nivel de azcar en la sangre es igual o mayor que 240mg/dl (13.3mmol/dl).  Su nivel de azcar en la sangre es igual o mayor que 200mg/dl (11.1mmol/l), y tiene cetonas en la orina.  Tiene fiebre.  Ha estado enferma durante 2 o ms das y no mejora.  Tiene cualquiera de estos problemas durante ms de 6horas: ? Vomita cada vez que come o bebe. ? Presenta heces lquidas (diarrea). Solicite ayuda de inmediato si:  No puede pensar con claridad.  No respira bien.  Tiene mucha cantidad de cetonas en la orina.  El beb parece moverse menos de lo normal.  Le comienza a salir lquido o sangre anmalos de la vagina.  Comienza a tener contracciones antes de la fecha de parto. Siente que el vientre se endurece.  Tiene un dolor de cabeza muy intenso. Estos sntomas pueden indicar una emergencia. Solicite ayuda de inmediato. Comunquese con el servicio de emergencias de su localidad (911 en   los [place]).  No espere a ver si los sntomas desaparecen.  No conduzca por sus propios medios hasta el hospital. Resumen  La diabetes gestacional es una forma de diabetes.  Puede manifestarse mientras est embarazada.  Esta afeccin ocurre cuando el cuerpo no puede producir insulina o no puede utilizarla de la manera correcta.  Siga una dieta saludable, haga actividad fsica y use los medicamentos o la insulina como se lo haya indicado el mdico.  Informe a su mdico si su nivel de azcar en la sangre es alto, tiene fiebre o vomita cada vez que come o bebe.  Obtenga ayuda de inmediato si no puede pensar con claridad, no respira bien o su beb parece moverse menos de lo normal. Esta informacin no tiene como fin reemplazar el consejo del mdico. Asegrese de hacerle al mdico cualquier pregunta que tenga. Document Revised: 04/25/2020 Document Reviewed: 04/25/2020 Elsevier Patient Education  2021 Elsevier Inc.  

## 2020-11-20 ENCOUNTER — Telehealth: Payer: Self-pay

## 2020-11-20 NOTE — Telephone Encounter (Signed)
Created GFE for patient's upcoming appointment and am sending it out via mail.

## 2020-11-21 ENCOUNTER — Other Ambulatory Visit: Payer: Self-pay

## 2020-11-23 ENCOUNTER — Encounter: Payer: Self-pay | Admitting: Registered"

## 2020-11-23 NOTE — Progress Notes (Unsigned)
Patient was not able to come in for gestational diabetes education on Monday 11/20/20 due to sickness. Pt reports prior history of GDM and knowledge of how to check blood sugar. RD provided glucometer to enable patient to start CBGs to have data for her appointment later in the week.  Monitor provided: [employer], Log [id number], plus 30 day supply

## 2020-11-24 ENCOUNTER — Encounter: Payer: Self-pay | Admitting: *Deleted

## 2020-11-24 ENCOUNTER — Other Ambulatory Visit: Payer: Self-pay

## 2020-11-24 ENCOUNTER — Ambulatory Visit: Payer: Self-pay | Attending: Obstetrics and Gynecology

## 2020-11-24 ENCOUNTER — Other Ambulatory Visit: Payer: Self-pay | Admitting: *Deleted

## 2020-11-24 ENCOUNTER — Ambulatory Visit: Payer: Self-pay | Admitting: *Deleted

## 2020-11-24 ENCOUNTER — Other Ambulatory Visit: Payer: Self-pay | Admitting: Obstetrics & Gynecology

## 2020-11-24 DIAGNOSIS — Z3A32 32 weeks gestation of pregnancy: Secondary | ICD-10-CM

## 2020-11-24 DIAGNOSIS — O0990 Supervision of high risk pregnancy, unspecified, unspecified trimester: Secondary | ICD-10-CM | POA: Insufficient documentation

## 2020-11-24 DIAGNOSIS — O3660X0 Maternal care for excessive fetal growth, unspecified trimester, not applicable or unspecified: Secondary | ICD-10-CM

## 2020-11-24 DIAGNOSIS — O09293 Supervision of pregnancy with other poor reproductive or obstetric history, third trimester: Secondary | ICD-10-CM

## 2020-11-24 DIAGNOSIS — O24419 Gestational diabetes mellitus in pregnancy, unspecified control: Secondary | ICD-10-CM | POA: Insufficient documentation

## 2020-11-24 DIAGNOSIS — O209 Hemorrhage in early pregnancy, unspecified: Secondary | ICD-10-CM

## 2020-11-24 DIAGNOSIS — O09523 Supervision of elderly multigravida, third trimester: Secondary | ICD-10-CM

## 2020-11-24 DIAGNOSIS — O409XX0 Polyhydramnios, unspecified trimester, not applicable or unspecified: Secondary | ICD-10-CM

## 2020-11-24 DIAGNOSIS — O24415 Gestational diabetes mellitus in pregnancy, controlled by oral hypoglycemic drugs: Secondary | ICD-10-CM

## 2020-11-24 DIAGNOSIS — O403XX0 Polyhydramnios, third trimester, not applicable or unspecified: Secondary | ICD-10-CM

## 2020-11-24 DIAGNOSIS — O34219 Maternal care for unspecified type scar from previous cesarean delivery: Secondary | ICD-10-CM

## 2020-11-30 ENCOUNTER — Other Ambulatory Visit: Payer: Self-pay

## 2020-11-30 ENCOUNTER — Ambulatory Visit (INDEPENDENT_AMBULATORY_CARE_PROVIDER_SITE_OTHER): Payer: Self-pay | Admitting: Family Medicine

## 2020-11-30 VITALS — BP 109/66 | HR 97 | Wt 163.6 lb

## 2020-11-30 DIAGNOSIS — M62838 Other muscle spasm: Secondary | ICD-10-CM

## 2020-11-30 DIAGNOSIS — O0990 Supervision of high risk pregnancy, unspecified, unspecified trimester: Secondary | ICD-10-CM

## 2020-11-30 DIAGNOSIS — O34219 Maternal care for unspecified type scar from previous cesarean delivery: Secondary | ICD-10-CM

## 2020-11-30 DIAGNOSIS — O24410 Gestational diabetes mellitus in pregnancy, diet controlled: Secondary | ICD-10-CM

## 2020-11-30 DIAGNOSIS — O209 Hemorrhage in early pregnancy, unspecified: Secondary | ICD-10-CM

## 2020-11-30 MED ORDER — CYCLOBENZAPRINE HCL 10 MG PO TABS: 10.0000 mg | ORAL_TABLET | Freq: Three times a day (TID) | ORAL | 1 refills | Status: DC | PRN

## 2020-11-30 NOTE — Progress Notes (Signed)
   PRENATAL VISIT NOTE  Subjective:  Victoria Richmond is a 35 y.o. G4P3003 at [redacted]w[redacted]d being seen today for ongoing prenatal care.  She is currently monitored for the following issues for this high-risk pregnancy and has Breast pain, left; Previous cesarean delivery affecting pregnancy, antepartum; Gestational diabetes mellitus, antepartum; Bladder laceration as postoperative complication; Vaginal bleeding in pregnancy, first trimester; History of gestational diabetes mellitus; and Supervision of high risk pregnancy, antepartum on their problem list.  Patient reports no complaints.  Contractions: Irritability. Vag. Bleeding: None.  Movement: Present. Denies leaking of fluid.   The following portions of the patient's history were reviewed and updated as appropriate: allergies, current medications, past family history, past medical history, past social history, past surgical history and problem list.   Objective:   Vitals:   11/30/20 0836  BP: 109/66  Pulse: 97  Weight: 163 lb 9.6 oz (74.2 kg)    Fetal Status: Fetal Heart Rate (bpm): 130   Movement: Present     General:  Alert, oriented and cooperative. Patient is in no acute distress.  Skin: Skin is warm and dry. No rash noted.   Cardiovascular: Normal heart rate noted  Respiratory: Normal respiratory effort, no problems with respiration noted  Abdomen: Soft, gravid, appropriate for gestational age.  Pain/Pressure: Present     Pelvic: Cervical exam deferred        Extremities: Normal range of motion.  Edema: None  Mental Status: Normal mood and affect. Normal behavior. Normal judgment and thought content.   Assessment and Plan:  Pregnancy: G4P3003 at [redacted]w[redacted]d 1. Supervision of high risk pregnancy, antepartum Having upper back pain, recommend warm compresses and gentle stretches. Ok to use tylenol if needed.  Reviewed upcoming appt and [place] Has CS schedule 4/4 with [facility]  2. Vaginal bleeding in pregnancy, first trimester None  today  3. Previous cesarean delivery affecting pregnancy, antepartum Severe adhesive dx Repeat at 39 wks with BTL  4. Gestational diabetes mellitus (GDM) in third trimester, gestational diabetes method of control unspecified [place] 77-88 2h 75-120 (only one value is 120) Good control Patient has birthday coming up. We discussed given her excellent control she can have one "free day" and not check on her birthday which is [birth year]. She was grateful.   Preterm labor symptoms and general obstetric precautions including but not limited to vaginal bleeding, contractions, leaking of fluid and fetal movement were reviewed in detail with the patient. Please refer to After Visit Summary for other counseling recommendations.   Return in about 2 weeks (around 12/14/2020) for Routine prenatal care, MD or APP.  Future Appointments  Date Time Provider Department Center  12/01/2020  9:15 AM [facility] NURSE [facility]  12/01/2020  9:30 AM [facility] US3 [facility]  12/05/2020  9:15 AM [facility]  12/07/2020 11:00 AM [facility] NURSE [facility]  12/07/2020 11:15 AM [facility] US2 [facility]  12/14/2020  9:15 AM [facility] NURSE [facility]  12/14/2020  9:30 AM [facility] US3 [facility]  12/21/2020 10:45 AM [facility] NURSE [facility]  12/21/2020 11:00 AM [facility] US1 [facility]    Federico Flake, MD

## 2020-12-01 ENCOUNTER — Other Ambulatory Visit: Payer: Self-pay | Admitting: Obstetrics & Gynecology

## 2020-12-01 ENCOUNTER — Encounter: Payer: Self-pay | Admitting: *Deleted

## 2020-12-01 ENCOUNTER — Ambulatory Visit: Payer: Self-pay | Attending: Maternal & Fetal Medicine

## 2020-12-01 ENCOUNTER — Ambulatory Visit: Payer: Self-pay | Admitting: *Deleted

## 2020-12-01 DIAGNOSIS — O409XX0 Polyhydramnios, unspecified trimester, not applicable or unspecified: Secondary | ICD-10-CM | POA: Insufficient documentation

## 2020-12-01 DIAGNOSIS — O209 Hemorrhage in early pregnancy, unspecified: Secondary | ICD-10-CM

## 2020-12-01 DIAGNOSIS — O0990 Supervision of high risk pregnancy, unspecified, unspecified trimester: Secondary | ICD-10-CM

## 2020-12-01 DIAGNOSIS — O24419 Gestational diabetes mellitus in pregnancy, unspecified control: Secondary | ICD-10-CM | POA: Insufficient documentation

## 2020-12-01 DIAGNOSIS — O3660X0 Maternal care for excessive fetal growth, unspecified trimester, not applicable or unspecified: Secondary | ICD-10-CM | POA: Insufficient documentation

## 2020-12-01 MED ORDER — METRONIDAZOLE 500 MG PO TABS: ORAL_TABLET | ORAL | 0 refills | Status: DC

## 2020-12-01 NOTE — Progress Notes (Signed)
Meds ordered this encounter  Medications  . metroNIDAZOLE (FLAGYL) 500 MG tablet    Sig: Take two tablets by mouth twice a day, for one day.  Or you can take all four tablets at once if you can tolerate it.    Dispense:  4 tablet    Refill:  0    

## 2020-12-04 ENCOUNTER — Telehealth: Payer: Self-pay

## 2020-12-04 NOTE — Telephone Encounter (Signed)
Created GFE for all upcoming appointments and mailed them out.

## 2020-12-05 ENCOUNTER — Inpatient Hospital Stay (HOSPITAL_COMMUNITY)
Admission: AD | Admit: 2020-12-05 | Discharge: 2020-12-05 | Disposition: A | Discharge status: Home / Self Care | Payer: Self-pay | Attending: Obstetrics & Gynecology | Admitting: Obstetrics & Gynecology

## 2020-12-05 ENCOUNTER — Encounter (HOSPITAL_COMMUNITY): Payer: Self-pay | Admitting: Obstetrics & Gynecology

## 2020-12-05 ENCOUNTER — Encounter: Payer: Self-pay | Attending: Obstetrics & Gynecology | Admitting: Registered"

## 2020-12-05 ENCOUNTER — Ambulatory Visit (INDEPENDENT_AMBULATORY_CARE_PROVIDER_SITE_OTHER): Payer: Self-pay | Admitting: Obstetrics and Gynecology

## 2020-12-05 ENCOUNTER — Other Ambulatory Visit: Payer: Self-pay

## 2020-12-05 ENCOUNTER — Ambulatory Visit: Payer: Self-pay | Admitting: Registered"

## 2020-12-05 VITALS — BP 113/63 | HR 91

## 2020-12-05 DIAGNOSIS — Z3A34 34 weeks gestation of pregnancy: Secondary | ICD-10-CM | POA: Insufficient documentation

## 2020-12-05 DIAGNOSIS — Z3689 Encounter for other specified antenatal screening: Secondary | ICD-10-CM | POA: Insufficient documentation

## 2020-12-05 DIAGNOSIS — O4703 False labor before 37 completed weeks of gestation, third trimester: Secondary | ICD-10-CM

## 2020-12-05 DIAGNOSIS — Z3A00 Weeks of gestation of pregnancy not specified: Secondary | ICD-10-CM | POA: Insufficient documentation

## 2020-12-05 DIAGNOSIS — O4700 False labor before 37 completed weeks of gestation, unspecified trimester: Secondary | ICD-10-CM

## 2020-12-05 DIAGNOSIS — O0990 Supervision of high risk pregnancy, unspecified, unspecified trimester: Secondary | ICD-10-CM

## 2020-12-05 DIAGNOSIS — Z20822 Contact with and (suspected) exposure to covid-19: Secondary | ICD-10-CM | POA: Insufficient documentation

## 2020-12-05 DIAGNOSIS — O24419 Gestational diabetes mellitus in pregnancy, unspecified control: Secondary | ICD-10-CM | POA: Insufficient documentation

## 2020-12-05 DIAGNOSIS — Z98891 History of uterine scar from previous surgery: Secondary | ICD-10-CM

## 2020-12-05 DIAGNOSIS — O34219 Maternal care for unspecified type scar from previous cesarean delivery: Secondary | ICD-10-CM | POA: Insufficient documentation

## 2020-12-05 DIAGNOSIS — O209 Hemorrhage in early pregnancy, unspecified: Secondary | ICD-10-CM

## 2020-12-05 LAB — URINALYSIS, ROUTINE W REFLEX MICROSCOPIC
Bilirubin Urine: NEGATIVE
Glucose, UA: NEGATIVE mg/dL
Hgb urine dipstick: NEGATIVE
Ketones, ur: 20 mg/dL — AB
Leukocytes,Ua: NEGATIVE
Nitrite: NEGATIVE
Protein, ur: NEGATIVE mg/dL
Specific Gravity, Urine: 1.009 (ref 1.005–1.030)
pH: 6 (ref 5.0–8.0)

## 2020-12-05 LAB — RESP PANEL BY RT-PCR (FLU A&B, COVID) ARPGX2
Influenza A by PCR: NEGATIVE
Influenza B by PCR: NEGATIVE
SARS Coronavirus 2 by RT PCR: NEGATIVE

## 2020-12-05 MED ORDER — LACTATED RINGERS IV BOLUS
1000.0000 mL | Freq: Once | INTRAVENOUS | Status: AC
  Administered 2020-12-05: 1000 mL via INTRAVENOUS

## 2020-12-05 NOTE — MAU Note (Addendum)
Pt sent from office painful contractions on monitor.  She was closed per SVE at 1030.

## 2020-12-05 NOTE — MAU Provider Note (Addendum)
History     CSN: [id number]  Arrival date and time: 12/05/20 1121   Event Date/Time   First Provider Initiated Contact with Patient 12/05/20 1215      Chief Complaint  Patient presents with   Contractions   [name] is a 35 y.o. G4P3003 at [redacted]w[redacted]d who presents to MAU for PTL evaluation after patient was in the office for a ROB visit and was reporting painful contractions. Per Dr. Myriam Richmond, patient was placed on the monitor and found to be having contractions q17min. Dr. Myriam Richmond reports cervix was closed in office, but patient is scheduled for repeat C/S and has hx of C/S x3.  Pt denies change in vaginal discharge amount/color/consistency, VB, new onset backache, intermittent abdominal discomfort/pain, pelvic pressure/pain, cramping. Pt denies chest pain and SOB.  Pt denies constipation, diarrhea, or urinary problems. Pt denies fever, chills, fatigue, sweating or changes in appetite. Pt denies dizziness, light-headedness, weakness.  Pt denies VB, LOF and reports good FM.  Current pregnancy problems? GDM, hx C/S x3 with need for vertical incision this pregnancy Blood Type? O Positive Allergies? NKDA Current PNC & next appt? [facility], next appt 12/07/2020  Spanish translator used for entire visit.   OB History     Gravida  4   Para  3   Term  3   Preterm  0   AB  0   Living  3      SAB  0   IAB  0   Ectopic  0   Multiple  0   Live Births  3           Past Medical History:  Diagnosis Date   Anemia    Breast pain, left 12/08/2012   Referred patient to the [facility] for left breast ultrasound. Appointment scheduled for Monday, December 14, 2012 at 0850.   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        Past Surgical History:  Procedure Laterality Date   BLADDER NECK RECONSTRUCTION N/A 01/29/2014   Procedure: Repair of Cystotomy;  Surgeon: Victoria Newcomer, MD;  Location: [facility];  Service: Obstetrics;  Laterality: N/A;   CESAREAN SECTION     CESAREAN SECTION N/A 01/29/2014   Procedure: CESAREAN SECTION;  Surgeon: Victoria Newcomer, MD;  Location: [facility];  Service: Obstetrics;  Laterality: N/A;    Family History  Problem Relation Age of Onset   Diabetes Paternal Aunt    Diabetes Mother     Social History   Tobacco Use   Smoking status: Never Smoker   Smokeless tobacco: Never Used  Vaping Use   Vaping Use: Never used  Substance Use Topics   Alcohol use: No   Drug use: No    Allergies: No Known Allergies  No medications prior to admission.    Review of Systems  Constitutional: Negative for chills, diaphoresis, fatigue and fever.  Eyes: Negative for visual disturbance.  Respiratory: Negative for  shortness of breath.   Cardiovascular: Negative for chest pain.  Gastrointestinal: Positive for abdominal pain (ctx). Negative for constipation, diarrhea, nausea and vomiting.  Genitourinary: Negative for dysuria, flank pain, frequency, pelvic pain, urgency, vaginal bleeding and vaginal discharge.  Neurological: Negative for dizziness, weakness, light-headedness and headaches.   Physical Exam   Blood pressure 106/63, pulse 87, temperature 98.2 F (36.8 C), temperature source Oral, resp. rate 16, height 4\' 11"  (1.499 m), weight 72.9 kg, last menstrual period 04/09/2020, SpO2 100 %.  Patient Vitals for the past 24 hrs:  BP Temp Temp src Pulse Resp SpO2 Height Weight  12/05/20 1550 106/63 -- -- 87 16 100 % -- --  12/05/20 1147 109/63 98.2 F (36.8 C) Oral (!) 115 16 99 % 4\' 11"  (1.499 m) --  12/05/20 1139 -- -- -- -- -- -- -- 72.9 kg    Physical Exam Vitals and nursing note reviewed.  Constitutional:      General: She is not in acute distress.    Appearance: Normal appearance. She is not ill-appearing, toxic-appearing or diaphoretic.  HENT:     Head: Normocephalic and atraumatic.  Pulmonary:     Effort: Pulmonary effort is normal.  Abdominal:     Palpations: Abdomen is soft.  Skin:    General: Skin is warm and dry.  Neurological:     Mental Status: She is alert and oriented to person, place, and time.  Psychiatric:        Mood and Affect: Mood normal.        Behavior: Behavior normal.        Thought Content: Thought content normal.        Judgment: Judgment normal.    Results for orders placed or performed during the hospital encounter of 12/05/20 (from the past 24 hour(s))  Resp Panel by RT-PCR (Flu A&B, Covid) Nasopharyngeal Swab     Status: None   Collection Time: 12/05/20 12:10 PM   Specimen: Nasopharyngeal Swab; Nasopharyngeal(NP) swabs in vial transport medium  Result Value Ref Range   SARS Coronavirus 2 by RT PCR NEGATIVE NEGATIVE   Influenza A by PCR NEGATIVE NEGATIVE   Influenza B by PCR NEGATIVE NEGATIVE  Urinalysis, Routine w reflex microscopic Urine, Clean Catch     Status: Abnormal   Collection Time: 12/05/20  4:53 PM  Result Value Ref Range   Color, Urine YELLOW YELLOW   APPearance HAZY (A) CLEAR   Specific Gravity, Urine 1.009 1.005 - 1.030   pH 6.0 5.0 - 8.0   Glucose, UA NEGATIVE NEGATIVE mg/dL   Hgb urine dipstick NEGATIVE NEGATIVE   Bilirubin Urine NEGATIVE NEGATIVE   Ketones, ur 20 (A) NEGATIVE mg/dL   Protein, ur NEGATIVE NEGATIVE mg/dL   Nitrite NEGATIVE NEGATIVE   Leukocytes,Ua NEGATIVE NEGATIVE   [place] MFM FETAL BPP WO NON STRESS  Result Date: 12/01/2020 ----------------------------------------------------------------------  OBSTETRICS REPORT                       (Signed Final 12/01/2020 10:31 am) ---------------------------------------------------------------------- Patient  Info  ID #:       [id number]                          D.O.B.:  [birth year] (34 yrs)  Name:       Victoria Deis-                 Visit Date: 12/01/2020 09:30 am  GODINEZ ---------------------------------------------------------------------- Performed By  Attending:        Ma Rings MD         Ref. Address:     [address]                                                             [place], [place]                                                             [address]  Performed By:     Truitt Leep,      Location[facility]                    RD[facility]RD[facility]                                [facility]  Referred By:      [facility] ---------------------------------------------------------------------- Orders  #  Description                           Code        Ordered By  1  [place] MFM FETAL BPP WO NON               76819.01    [facility]     STRESS                                            BOOKER ----------------------------------------------------------------------  #  Order #                     Accession #                Episode #  1  [id number]                   [id number]                 [id number] ---------------------------------------------------------------------- Indications  Gestational diabetes in  pregnancy,             O24.415  controlled by oral hypoglycemic drugs  (metformin)  Polyhydramnios, third trimester, antepartum    O40.3XX0  condition or complication, unspecified fetus  Advanced maternal age multigravida 72+,        O67.523  third trimester  Previous cesarean delivery, antepartum x 3     O34.219  Poor obstetric history: Previous gestational   O09.299  diabetes  Encounter for other antenatal screening        Z36.2  follow-up  [redacted] weeks gestation of pregnancy                Z3A.33  ---------------------------------------------------------------------- Fetal Evaluation  Num Of Fetuses:         1  Cardiac Activity:       Observed  Presentation:           Cephalic  Placenta:               Anterior  P. Cord Insertion:      Visualized, central  Amniotic Fluid  AFI FV:      Polyhydramnios  AFI Sum(cm)     %Tile       Largest Pocket(cm)  32.1            > 97        10.8  RUQ(cm)       RLQ(cm)       LUQ(cm)        LLQ(cm)  6.31          8.06          6.92           10.8  Comment:    No evidence of Accreta (C/S x 3) ---------------------------------------------------------------------- Biophysical Evaluation  Amniotic F.V:   Pocket => 2 cm             F. Tone:        Observed  F. Movement:    Observed                   Score:          8/8  F. Breathing:   Observed ---------------------------------------------------------------------- Biometry  LV:        5.2  mm ---------------------------------------------------------------------- OB History  Gravidity:    4         Term:   3        Prem:   0        SAB:   0  TOP:          0       Ectopic:  0        Living: 3 ---------------------------------------------------------------------- Gestational Age  LMP:           33w 5d        Date:  04/09/20                 EDD:   01/14/21  Best:          33w 5d     Det. By:  LMP  (04/09/20)          EDD:   01/14/21 ---------------------------------------------------------------------- Anatomy  Cranium:               Appears normal         Heart:                  Appears normal                                                                        (  4CH, axis, and                                                                        situs)  Cavum:                 Appears normal         RVOT:                   Appears normal  Ventricles:            Appears normal         LVOT:                   Appears normal  Choroid Plexus:        Appears normal         Aortic Arch:            Appears normal  Cerebellum:             Appears normal         Ductal Arch:            Appears normal  Posterior Fossa:       Appears normal         Stomach:                Appears normal, left                                                                        sided  Face:                  Appears normal         Kidneys:                Appear normal                         (orbits and profile)  Lips:                  Previously seen        Bladder:                Appears normal  Other:  Other anatomy previously imaged and appeared normal ---------------------------------------------------------------------- Comments  This patient was seen for a biophysical profile due to  gestational diabetes that is currently treated with Metformin.  Polyhydramnios and a large for gestational age fetus was  noted during her last ultrasound one week ago.  She reports  that her fingerstick values are within normal limits.  A biophysical profile performed today was 8 out of 8.  Polyhydramnios with a total AFI of 32 cm continues to be  noted today.  Another biophysical profile was scheduled in 1 week.  Should polyhydramnios and a large for gestational age fetus  continue to be noted during her future ultrasound exams,  delivery may be considered at between 60 to 41  weeks. ----------------------------------------------------------------------                   Ma Rings, MD Electronically Signed Final Report   12/01/2020 10:31 am ----------------------------------------------------------------------  [place] MFM FETAL BPP WO NON STRESS  Result Date: 11/24/2020 ----------------------------------------------------------------------  OBSTETRICS REPORT                       (Signed Final 11/24/2020 12:16 pm) ---------------------------------------------------------------------- Patient Info  ID #:       [id number]                          D.O.B.:  26-Mar-1986 (34 yrs)  Name:       Victoria Deis-                 Visit Date: 11/24/2020 07:47 am              GODINEZ  ---------------------------------------------------------------------- Performed By  Attending:        Lin Landsman      Ref. Address:     [address]                    [place]                                                             [place], [place]                                                             [address]  Performed By:     Eden Lathe BS      Location:         [facility]                    RDMS RVT                                 [facility]  Referred By:      [facility] ---------------------------------------------------------------------- Orders  #  Description  Code        Ordered By  1  [place] MFM OB DETAIL +14 WK               [id number]    Scheryl Darter  2  [place] MFM FETAL BPP WO NON               16109.60    Scheryl Darter     STRESS ----------------------------------------------------------------------  #  Order #                     Accession #                Episode #  1  [id number]                   [id number]                 [id number]  2  [id number]                   [id number]                 [id number] ---------------------------------------------------------------------- Indications  Antenatal screening for malformations          Z36.3  Advanced maternal age multigravida 21+,        O46.523  third trimester  Previous cesarean delivery, antepartum x 3     O34.219  Poor obstetric history: Previous gestational   O09.299  diabetes  Gestational diabetes in pregnancy,             O24.415  controlled by oral hypoglycemic drugs  (metformin)  [redacted] weeks gestation of pregnancy                Z3A.32  Polyhydramnios, third trimester, antepartum    O40.3XX0  condition or complication, unspecified fetus ---------------------------------------------------------------------- Fetal Evaluation  Num Of Fetuses:          1  Fetal Heart Rate(bpm):  147  Cardiac Activity:       Observed  Presentation:           Breech  Placenta:               Anterior  P. Cord Insertion:      Visualized  Amniotic Fluid  AFI FV:      Polyhydramnios  AFI Sum(cm)     %Tile       Largest Pocket(cm)  28.3            > 97        9  RUQ(cm)       RLQ(cm)       LUQ(cm)        LLQ(cm)  9             6.6           5.1            7.6 ---------------------------------------------------------------------- Biophysical Evaluation  Amniotic F.V:   Within normal limits       F. Tone:        Observed  F. Movement:    Observed                   Score:          8/8  F. Breathing:   Observed ---------------------------------------------------------------------- Biometry  BPD:      91.4  mm     G. Age:  37w 1d       >  99  %    CI:         76.9   %    70 - 86                                                          FL/HC:      19.7   %    19.9 - 21.5  HC:      330.1  mm     G. Age:  37w 4d       > 99  %    HC/AC:      0.99        0.96 - 1.11  AC:      333.7  mm     G. Age:  37w 2d       > 99  %    FL/BPD:     71.2   %    71 - 87  FL:       65.1  mm     G. Age:  33w 4d         62  %    FL/AC:      19.5   %    20 - 24  HUM:      56.8  mm     G. Age:  33w 0d         62  %  LV:        4.6  mm  Est. FW:    2924  gm      6 lb 7 oz   > 99  % ---------------------------------------------------------------------- OB History  Gravidity:    4         Term:   3        Prem:   0        SAB:   0  TOP:          0       Ectopic:  0        Living: 3 ---------------------------------------------------------------------- Gestational Age  LMP:           32w 5d        Date:  04/09/20                 EDD:   01/14/21  U/S Today:     36w 3d                                        EDD:   12/19/20  Best:          32w 5d     Det. By:  LMP  (04/09/20)          EDD:   01/14/21 ---------------------------------------------------------------------- Anatomy  Cranium:               Appears normal          LVOT:                   Appears normal  Cavum:                 Appears normal         Aortic Arch:  Appears normal  Ventricles:            Not well visualized    Ductal Arch:            Appears normal  Choroid Plexus:        Appears normal         Diaphragm:              Appears normal  Cerebellum:            Not well visualized    Stomach:                Appears normal, left                                                                        sided  Posterior Fossa:       Not well visualized    Abdomen:                Appears normal  Nuchal Fold:           Not applicable (>20    Abdominal Wall:         Not well visualized                         wks GA)  Face:                  Not well visualized    Cord Vessels:           Appears normal (3                                                                        vessel cord)  Lips:                  Not well visualized    Kidneys:                Appear normal  Palate:                Not well visualized    Bladder:                Appears normal  Thoracic:              Appears normal         Spine:                  Limited views                                                                        appear normal  Heart:  Not well visualized    Upper Extremities:      Visualized  RVOT:                  Appears normal         Lower Extremities:      Visualized  Other:  Left foot/heel visualized. Technically difficult due to advanced GA and          fetal position. ---------------------------------------------------------------------- Cervix Uterus Adnexa  Cervix  Length:           3.84  cm.  Normal appearance by transabdominal scan.  Uterus  No abnormality visualized.  Right Ovary  Not visualized.  Left Ovary  Not visualized.  Cul De Sac  No free fluid seen.  Adnexa  No abnormality visualized. ---------------------------------------------------------------------- Impression  Single intrauterine pregnancy here for a detailed anatomy  due to  type A2GDM  Normal anatomy with measurements large for dates with  dates  There is good fetal movement and amniotic fluid volume  Suboptimal views of the fetal anatomy were obtained  secondary to fetal position.  Biophysical profile 8/8  I discussed with Ms. Campos  the diagnosis of  polyhydramnios defined as the MVP > 8 or AFI >24 cm. We  discussed the common etiologies include idiopathic, poorly  gestational diabetes, aneupolidy, gastrointestinal conditions,  cranial facial defects, and neuromuscular conditions. She has  a normal NIPS and  negative horizon. Genetic screening not  performed but there were no previous markers of aneuploidy  observed. When the AFI is > 30 or > 11.9  we recommend  weekly testing however, given mild polyhydramnios is  observed we recommend daily kick counts and delivery at 39  weeks.  In addition we discussed starting daily low dose ASA for the  prevention of preeclampsia.  Ms. Patria Mane conveyed that her most of her blood sugars  including fasting and 2 hr pp are within range with an  occasional abnormal value. I reviewed the goals of her blood  sugar. ---------------------------------------------------------------------- Recommendations  Continue weekly testing  Repeat growth in 4 weeks. ----------------------------------------------------------------------               Lin Landsman, MD Electronically Signed Final Report   11/24/2020 12:16 pm ----------------------------------------------------------------------  [place] MFM OB DETAIL +14 WK  Result Date: 11/24/2020 ----------------------------------------------------------------------  OBSTETRICS REPORT                       (Signed Final 11/24/2020 12:16 pm) ---------------------------------------------------------------------- Patient Info  ID #:       [id number]                          D.O.B.:  [birth year] (34 yrs)  Name:       Victoria Deis-                 Visit Date: 11/24/2020 07:47 am              GODINEZ  ---------------------------------------------------------------------- Performed By  Attending:        Lin Landsman      Ref. Address:     [address]                    [place]  [place], [place]                                                             [address]  Performed By:     Eden Lathe BS      Location:         [facility]                    RDMS RVT                                 [facility]  Referred By:      [facility] ---------------------------------------------------------------------- Orders  #  Description                           Code        Ordered By  1  [place] MFM OB DETAIL +14 WK               76811.01    Scheryl Darter  2  [place] MFM FETAL BPP WO NON               19147.82    Scheryl Darter     STRESS ----------------------------------------------------------------------  #  Order #                     Accession #                Episode #  1  [id number]                   [id number]                 [id number]  2  [id number]                   [id number]                 [id number] ---------------------------------------------------------------------- Indications  Antenatal screening for malformations          Z36.3  Advanced maternal age multigravida 68+,        O59.523  third trimester  Previous cesarean delivery, antepartum x 3     O34.219  Poor obstetric history: Previous gestational   O09.299  diabetes  Gestational diabetes in pregnancy,  O24.415  controlled by oral hypoglycemic drugs  (metformin)  [redacted] weeks gestation of pregnancy                Z3A.32  Polyhydramnios, third trimester, antepartum    O40.3XX0  condition or complication, unspecified fetus ---------------------------------------------------------------------- Fetal Evaluation  Num Of Fetuses:          1  Fetal Heart Rate(bpm):  147  Cardiac Activity:       Observed  Presentation:           Breech  Placenta:               Anterior  P. Cord Insertion:      Visualized  Amniotic Fluid  AFI FV:      Polyhydramnios  AFI Sum(cm)     %Tile       Largest Pocket(cm)  28.3            > 97        9  RUQ(cm)       RLQ(cm)       LUQ(cm)        LLQ(cm)  9             6.6           5.1            7.6 ---------------------------------------------------------------------- Biophysical Evaluation  Amniotic F.V:   Within normal limits       F. Tone:        Observed  F. Movement:    Observed                   Score:          8/8  F. Breathing:   Observed ---------------------------------------------------------------------- Biometry  BPD:      91.4  mm     G. Age:  37w 1d       > 99  %    CI:         76.9   %    70 - 86                                                          FL/HC:      19.7   %    19.9 - 21.5  HC:      330.1  mm     G. Age:  37w 4d       > 99  %    HC/AC:      0.99        0.96 - 1.11  AC:      333.7  mm     G. Age:  37w 2d       > 99  %    FL/BPD:     71.2   %    71 - 87  FL:       65.1  mm     G. Age:  33w 4d         62  %    FL/AC:      19.5   %    20 - 24  HUM:      56.8  mm     G. Age:  33w 0d  62  %  LV:        4.6  mm  Est. FW:    2924  gm      6 lb 7 oz   > 99  % ---------------------------------------------------------------------- OB History  Gravidity:    4         Term:   3        Prem:   0        SAB:   0  TOP:          0       Ectopic:  0        Living: 3 ---------------------------------------------------------------------- Gestational Age  LMP:           32w 5d        Date:  04/09/20                 EDD:   01/14/21  U/S Today:     36w 3d                                        EDD:   12/19/20  Best:          32w 5d     Det. By:  LMP  (04/09/20)          EDD:   01/14/21 ---------------------------------------------------------------------- Anatomy  Cranium:               Appears normal          LVOT:                   Appears normal  Cavum:                 Appears normal         Aortic Arch:            Appears normal  Ventricles:            Not well visualized    Ductal Arch:            Appears normal  Choroid Plexus:        Appears normal         Diaphragm:              Appears normal  Cerebellum:            Not well visualized    Stomach:                Appears normal, left                                                                        sided  Posterior Fossa:       Not well visualized    Abdomen:                Appears normal  Nuchal Fold:           Not applicable (>20    Abdominal Wall:         Not well visualized  wks GA)  Face:                  Not well visualized    Cord Vessels:           Appears normal (3                                                                        vessel cord)  Lips:                  Not well visualized    Kidneys:                Appear normal  Palate:                Not well visualized    Bladder:                Appears normal  Thoracic:              Appears normal         Spine:                  Limited views                                                                        appear normal  Heart:                 Not well visualized    Upper Extremities:      Visualized  RVOT:                  Appears normal         Lower Extremities:      Visualized  Other:  Left foot/heel visualized. Technically difficult due to advanced GA and          fetal position. ---------------------------------------------------------------------- Cervix Uterus Adnexa  Cervix  Length:           3.84  cm.  Normal appearance by transabdominal scan.  Uterus  No abnormality visualized.  Right Ovary  Not visualized.  Left Ovary  Not visualized.  Cul De Sac  No free fluid seen.  Adnexa  No abnormality visualized. ---------------------------------------------------------------------- Impression  Single intrauterine pregnancy here for a detailed anatomy  due to  type A2GDM  Normal anatomy with measurements large for dates with  dates  There is good fetal movement and amniotic fluid volume  Suboptimal views of the fetal anatomy were obtained  secondary to fetal position.  Biophysical profile 8/8  I discussed with Ms. Campos  the diagnosis of  polyhydramnios defined as the MVP > 8 or AFI >24 cm. We  discussed the common etiologies include idiopathic, poorly  gestational diabetes, aneupolidy, gastrointestinal conditions,  cranial facial defects, and neuromuscular conditions. She has  a normal NIPS and  negative horizon. Genetic screening not  performed but there were no previous markers of aneuploidy  observed. When the AFI is >  30 or > 11.9  we recommend  weekly testing however, given mild polyhydramnios is  observed we recommend daily kick counts and delivery at 39  weeks.  In addition we discussed starting daily low dose ASA for the  prevention of preeclampsia.  Ms. Patria Mane conveyed that her most of her blood sugars  including fasting and 2 hr pp are within range with an  occasional abnormal value. I reviewed the goals of her blood  sugar. ---------------------------------------------------------------------- Recommendations  Continue weekly testing  Repeat growth in 4 weeks. ----------------------------------------------------------------------               Lin Landsman, MD Electronically Signed Final Report   11/24/2020 12:16 pm ----------------------------------------------------------------------   MAU Course  Procedures  MDM -cervix checked on arrival, almost 2 hours after office visit, still closed -pt reports ctx have improved and spaced out -fluid bolus given, ctx further improved -EFM: reactive       -baseline: 135/130       -variability: moderate       -accels: present, 15x15       -decels: absent       -TOCO: ctx, irritability -cervix continues to be closed at 330PM, pt reports ctx entirely resolved  -pt discharged to home in stable  condition  Orders Placed This Encounter  Procedures   Resp Panel by RT-PCR (Flu A&B, Covid) Nasopharyngeal Swab    Standing Status:   Standing    Number of Occurrences:   1    Order Specific Question:   Is this test for diagnosis or screening    Answer:   Screening    Order Specific Question:   Symptomatic for COVID-19 as defined by CDC    Answer:   No    Order Specific Question:   Hospitalized for COVID-19    Answer:   No    Order Specific Question:   Admitted to ICU for COVID-19    Answer:   No    Order Specific Question:   Previously tested for COVID-19    Answer:   No    Order Specific Question:   Resident in a congregate (group) care setting    Answer:   No    Order Specific Question:   Employed in healthcare setting    Answer:   No    Order Specific Question:   Pregnant    Answer:   Yes    Order Specific Question:   Has patient completed COVID vaccination(s) (2 doses of Pfizer/Moderna 1 dose of [employer])    Answer:   Yes    Order Specific Question:   Has patient completed COVID Booster / 3rd dose    Answer:   No   Urinalysis, Routine w reflex microscopic Urine, Clean Catch    Standing Status:   Standing    Number of Occurrences:   1   Diet NPO time specified    Standing Status:   Standing    Number of Occurrences:   1   Airborne and Contact precautions    Standing Status:   Standing    Number of Occurrences:   1   Insert peripheral IV    Standing Status:   Standing    Number of Occurrences:   1   Discharge patient    Order Specific Question:   Discharge disposition    Answer:   01-Home or Self Care [1]    Order Specific Question:   Discharge patient date    Answer:  12/05/2020   Meds ordered this encounter  Medications   lactated ringers bolus 1,000 mL    Assessment and Plan   1. Preterm contractions   2. Supervision of high risk pregnancy, antepartum   3. Prior C-section x 3     4. [redacted] weeks gestation of pregnancy   5. NST (non-stress test)  reactive    Allergies as of 12/05/2020   No Known Allergies      Medication List     TAKE these medications    cyclobenzaprine 10 MG tablet Commonly known as: FLEXERIL Take 1 tablet (10 mg total) by mouth every 8 (eight) hours as needed for muscle spasms.   metFORMIN 500 MG tablet Commonly known as: Glucophage Take 1 tablet (500 mg total) by mouth 2 (two) times daily with a meal.   metroNIDAZOLE 500 MG tablet Commonly known as: FLAGYL Take two tablets by mouth twice a day, for one day.  Or you can take all four tablets at once if you can tolerate it.   PRENATAL VITAMINS PO Take by mouth.       -discussed s/sx of PTL and when to return to MAU -f/u as planned in office on 12/07/2020 -MAU precautions given -pt discharged to home in stable condition  Odie Sera Nugent 12/05/2020, 5:08 PM   Attestation of Attending Supervision of Advanced Practice Provider (PA/CNM/NP): Evaluation and management procedures were performed by the Advanced Practice Provider under my supervision and collaboration.  I have reviewed the Advanced Practice Provider's note and chart, and I agree with the management and plan. I have also made any necessary editorial changes.   Sharon Seller, DO Attending Obstetrician & Gynecologist, [facility] for [facility], [facility] 12/06/2020 12:12 PM

## 2020-12-05 NOTE — Progress Notes (Signed)
Interpreter services provided by Dondra Prader #[id number] from [employer]  Patient was seen on 12/05/20 for Gestational Diabetes self-management. EDD 01/14/2021; [redacted]w[redacted]d Patient states prior history of GDM. Patient currently taking Metformin 500 mg bid.  Diet history obtained. Patient eats variety of all food groups. Beverages include water.  Patient reports walking 20-25 min daily. Pt states when she was diagnosed she stopped drinking soda and juice. Pt states she feels better with changes. Per dietary recall patient may not be meeting recommended caloric intake, but patient states she is eating enough to feel satisfied.  Patient cancelled original appointment 2 weeks ago due to covid symptoms but stopped by office to pick up Prodigy meter and started checking blood sugar.  Patient was in visible pain during appointment. RN came in to evaluate patient and due to potential contractions diabetes education was ended early so patient could be evaluated by clinical staff.   The following learning objectives were met by the patient :   States the definition of Gestational Diabetes  States why dietary management is important in controlling blood glucose  Describes the effects of carbohydrates on blood glucose levels  Demonstrates ability to create a balanced meal plan  Demonstrates carbohydrate counting   States when to check blood glucose levels  Demonstrates proper blood glucose monitoring techniques  States the effect of stress and exercise on blood glucose levels  States the importance of limiting caffeine and abstaining from alcohol and smoking  Plan:  Aim for 3 Carbohydrate Choices per meal (45 grams) +/- 1 either way  Aim for 1-2 Carbohydrate Choices per snack Begin reading food labels for Total Carbohydrate of foods If OK with your MD, consider  increasing your activity level by walking, Arm Chair Exercises or other activity daily as tolerated Begin checking Blood Glucose before breakfast and 2  hours after first bite of breakfast, lunch and dinner as directed by MD  Bring Log Book/Sheet and meter to every medical appointment  Baby Scripts: (BS 2.0 not capable of glucose management at this time.) Patient to record blood sugar on glucose log sheet  Take medication if directed by MD   Patient already has a meter, is testing pre breakfast and 2 hours after each meal.    Patient instructed to monitor glucose levels: FBS: 60 - 95 mg/dl 2 hour: <120 mg/dl  Patient received the following handouts:  Nutrition Diabetes and Pregnancy  Carbohydrate Counting List  Blood glucose Log Sheet  Patient will be seen for follow-up in 2 weeks or as needed.

## 2020-12-05 NOTE — Discharge Instructions (Signed)
Parto prematuro Preterm Labor La duracin normal de un embarazo es de 39 a 41semanas. Se llama trabajo de parto prematuro cuando este se inicia antes de las 37semanas de [place]. Los bebs que nacen de forma prematura y sobreviven pueden no estar completamente desarrollados y correr un mayor riesgo de tener problemas a largo plazo, como parlisis cerebral, retrasos en el desarrollo y problemas de la vista y el odo. Los bebs que nacen antes de tiempo pueden tener problemas poco despus del nacimiento. Esos problemas pueden estar relacionados con la regulacin del azcar en la sangre, la temperatura corporal, la frecuencia cardaca y la frecuencia respiratoria. Estos bebs suelen tener problemas para alimentarse. El riesgo de tener problemas es mayor para los bebs que nacen antes de las 34semanas de Psychiatrist. Cules son las causas? Se desconoce la causa exacta de esta afeccin. Qu incrementa el riesgo? Es ms probable que tenga un parto prematuro si tiene ciertos factores de riesgo que guardan relacin con sus antecedentes mdicos, problemas en el embarazo en curso y en los [place], y factores relacionados con el estilo de vida. Antecedentes mdicos  Tiene anormalidades en el tero, por ejemplo, el cuello uterino corto.  Tiene ITS (infecciones de transmisin sexual) u otras infecciones en las vas urinarias y la vagina.  Tiene enfermedades crnicas, como problemas de coagulacin sangunea, diabetes o hipertensin arterial.  Tiene sobrepeso o bajo peso. Embarazo en curso y Insurance underwriter anteriores  Ha tenido trabajo de parto prematuro anteriormente.  Tiene un embarazo gemelar o mltiple.  Le han diagnosticado una afeccin en la que la placenta cubre el cuello uterino (placenta previa).  Esper menos de 6 meses entre un parto y un Psychologist, occupational.  El beb en gestacin tiene algunas anormalidades.  Tiene sangrado vaginal durante el embarazo.  Qued embarazada a travs de la  fertilizacin in vitro (FIV). Factores relacionados con el ambiente y el estilo de vida  Consume productos que contienen tabaco.  Bebe alcohol.  Consume drogas ilegales.  Tiene estrs y no cuenta con apoyo social.  Sufre violencia domstica.  Est expuesta a ciertas sustancias qumicas o contaminantes ambientales. Otros factores  Es Adult nurse de 17aos o mayor de 35aos de edad. Cules son los signos o sntomas? Los sntomas de esta afeccin incluyen:  Educational psychologist similares a los que ocurren durante el perodo menstrual. Los calambres pueden presentarse con diarrea.  Dolor en el abdomen o en la parte inferior de la espalda.  Contracciones regulares que se pueden sentir como un endurecimiento del abdomen.  Una sensacin de mayor presin en la pelvis.  Aumento de la secrecin de mucosidad acuosa o sanguinolenta de la vagina.  Rotura de bolsa (rotura de saco amnitico). Cmo se diagnostica? Esta afeccin se diagnostica en funcin de lo siguiente:  Los antecedentes mdicos y un examen fsico.  Un examen plvico.  Sherlyn Lees.  Monitorear el tero para Tree surgeon.  Otros estudios, incluidos los siguientes: ? Un hisopado del cuello uterino para detectar una sustancia qumica llamada fibronectina fetal. ? Anlisis de [place]. Cmo se trata? El tratamiento de [address] afeccin depende del tiempo de su [place], su estado y la salud de su beb. El tratamiento puede incluir:  Tomar medicamentos como, por ejemplo: ? Medicamentos hormonales. Estos se pueden administrar de forma temprana en el embarazo para ayudar a Visual merchandiser. ? Medicamentos para TEFL teacher las contracciones. ? Medicamentos que ayudan a [employer] del beb. Estos se pueden recetar si el riesgo de parto es [place]. ? Medicamentos para evitar que  el beb desarrolle parlisis cerebral.  Reposo en cama. Si el trabajo de parto se inicia antes de las 34semanas de [place], es posible que deba  hospitalizarse.  Llevar a cabo el parto. Siga estas instrucciones en su casa:  No consuma ningn producto que contenga nicotina o tabaco, como cigarrillos, cigarrillos electrnicos y tabaco de Theatre manager. Si necesita ayuda para dejar de consumir estos productos, consulte al mdico.  No beba alcohol.  Use los medicamentos de venta libre y los recetados solamente como se lo haya indicado el mdico.  Haga reposo como se lo haya indicado el mdico.  Retome sus actividades normales segn lo indicado por el mdico. Pregntele al mdico qu actividades son seguras para usted.  Concurra a todas las visitas de [address] se lo haya indicado el mdico. Esto es importante.   Cmo se previene? Para aumentar las probabilidades de tener un embarazo a trmino, Financial planner en cuenta lo siguiente:  No consuma drogas ni medicamentos que no le hayan recetado Academic librarian.  Hable con el mdico antes de tomar suplementos a base de hierbas aunque los [employer].  Asegrese de llegar a un peso Office manager.  Tenga cuidado con las infecciones. Si cree que puede tener una infeccin, consulte al mdico para que la revisen. Los sntomas de infeccin pueden incluir los siguientes: ? [place]. ? Secrecin vaginal anormal o con mal olor. ? Ardor o dolor al [employer]oco[employer]illips. ? Necesidad urgente de Geographical information systems officer. ? Miccin frecuente u orinar pequeas cantidades con frecuencia. ? [employer]. ? Orina con mal olor u [employer].  Infrmele al mdico si ha tenido un trabajo de parto prematuro antes. Comunquese con un mdico si:  Piensa que est teniendo un trabajo de parto prematuro.  Tiene signos o sntomas de trabajo de [employer].  Tiene sntomas de infeccin. Solicite ayuda de inmediato si:  Tiene contracciones dolorosas y regulares cada 5 minutos o menos.  Rompe la bolsa. Resumen  Se llama trabajo de parto prematuro cuando se inicia antes de las 37 semanas  de [place].  El parto prematuro aumenta el riesgo del beb de desarrollar problemas de por vida.  No se conoce la causa exacta del trabajo de parto prematuro. Sin embargo, tener un tero anormal, una ITS (infeccin de transmisin sexual) o sangrado vaginal durante el embarazo aumenta el riesgo de [place] de [place].  Concurra a todas las visitas de [address] se lo haya indicado el mdico. Esto es importante.  Comunquese con un mdico si tiene signos o sntomas de trabajo de [employer]a. Esta informacin no tiene Theme park manager el consejo del mdico. Asegrese de hacerle al mdico cualquier pregunta que tenga. Document Revised: 01/03/2020 Document Reviewed: 01/03/2020 Elsevier Patient Education  2021 Elsevier Inc.         Dolor abdominal durante el embarazo Abdominal Pain During Pregnancy El dolor abdominal es comn durante el embarazo y tiene muchas causas posibles. Algunas causas son ms graves que otras, y a Advertising account executive causa se desconoce. El dolor abdominal puede ser un indicio de que est comenzando el [place]. Tambin puede ser ocasionado por el crecimiento normal del beb que provoca estiramiento de los msculos y ligamentos durante el Psychiatrist. Siempre informe a su mdico si siente dolor abdominal. Siga estas instrucciones en su casa:  No tenga relaciones sexuales ni se coloque nada dentro de la vagina hasta que el dolor haya desaparecido completamente.  Descanse todo lo que pueda Insurance account manager se  le haya calmado.  Beba suficiente lquido como para Pharmacologist la orina de color amarillo plido.  Use los medicamentos de venta libre y los recetados solamente como se lo haya indicado el mdico.  Cumpla con todas las visitas de seguimiento. Esto es importante.   Comunquese con un mdico si:  El dolor contina o empeora despus de Lawyer.  Siente dolor en la parte inferior del abdomen que: ? Va y viene en intervalos regulares. ? Se extiende a la  espalda. ? Es parecido a los Tree surgeon.  Siente dolor o ardor al Geographical information systems officer. Solicite ayuda de inmediato si:  Tiene fiebre, escalofros o falta de aire.  Tiene una hemorragia vaginal abundante.  Tiene prdida de lquidos o elimina tejidos por la vagina.  Vomita o tiene diarrea durante ms de 24horas.  El beb se mueve menos de lo habitual.  Se siente dbil o se desmaya.  Siente dolor intenso en la parte superior del abdomen. Resumen  El dolor abdominal es comn durante el [place] y tiene muchas causas posibles.  Si siente dolor abdominal durante el embarazo, informe al mdico de inmediato.  Siga las instrucciones del mdico para el cuidado en el hogar y concurra a todas las visitas de seguimiento como se lo hayan indicado. Esta informacin no tiene Theme park manager el consejo del mdico. Asegrese de hacerle al mdico cualquier pregunta que tenga. Document Revised: 07/13/2020 Document Reviewed: 07/13/2020 Elsevier Patient Education  2021 [employer].

## 2020-12-05 NOTE — Progress Notes (Signed)
   PRENATAL VISIT NOTE  Subjective:  Victoria Richmond is a 35 y.o. G4P3003 at [redacted]w[redacted]d being seen today for ongoing prenatal care.  She is currently monitored for the following issues for this high-risk pregnancy and has Breast pain, left; Previous cesarean delivery affecting pregnancy, antepartum; Gestational diabetes mellitus, antepartum; Bladder laceration as postoperative complication; Vaginal bleeding in pregnancy, first trimester; History of gestational diabetes mellitus; Supervision of high risk pregnancy, antepartum; Fetal macrosomia affecting management of mother, antepartum; and Polyhydramnios affecting pregnancy on their problem list.  Patient was initially being seen today for diabetes education but reported contractions. Upon evaluation patient reports abdominal pain starting around 6am this morning that has become increasingly painful. She is not sure she knows what contractions feel like (has had three c sections) but thinks this might be what they are.   She denies recent intercourse. Denies trauma. No pain with urination. No recent infections. No leakage of fluid, no abnormal vaginal discharge.   The following portions of the patient's history were reviewed and updated as appropriate: allergies, current medications, past family history, past medical history, past social history, past surgical history and problem list.   Objective:   Vitals:   12/05/20 1000  BP: 113/63  Pulse: 91    Fetal Status: Fetal Heart Rate (bpm): 150   Movement: Present     General:  Alert, oriented and cooperative. Patient is in no acute distress.  Skin: Skin is warm and dry. No rash noted.   Cardiovascular: Normal heart rate noted  Respiratory: Normal respiratory effort, no problems with respiration noted  Abdomen: Soft, gravid, appropriate for gestational age.  Pain/Pressure: Present     Pelvic: Cervical exam performed in the presence of a chaperone Dilation: Closed   Station: Ballotable   Extremities: Normal range of motion.  Edema: None  Mental Status: Normal mood and affect. Normal behavior. Normal judgment and thought content.   Assessment and Plan:  Pregnancy: G4P3003 at [redacted]w[redacted]d 1. Supervision of high risk pregnancy, antepartum - Fetal nonstress test  2. Preterm uterine contractions -cervix closed on exam but patient contracting q2 min on monitor, is visibly uncomfortable. Discussed with patient and plan for monitoring in MAU. Answered all patient questions. Called report to MAU provider.  - Fetal nonstress test  3. [redacted] weeks gestation of pregnancy   4. 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 High risk surgery. Of note, patient had many questions about her prior surgeries and feels that her surgeries were complicated by surgeon error, had minimal knowledge of adhesive disease. Attempted to answer some questions with assistance of in person interpreter. Will need further discussion.  Preterm labor symptoms and general obstetric precautions including but not limited to vaginal bleeding, contractions, leaking of fluid and fetal movement were reviewed in detail with the patient. Please refer to After Visit Summary for other counseling recommendations.   No follow-ups on file.  Future Appointments  Date Time Provider Department Center  12/07/2020 11:00 AM [facility] NURSE [place] [facility]  12/07/2020 11:15 AM [facility] US2 [facility]  12/14/2020  8:55 AM Adam Phenix, MD [facility]  12/14/2020  9:15 AM [facility] NURSE [facility]  12/14/2020  9:30 AM [facility] US3 [facility]  12/21/2020 10:45 AM [facility] NURSE [facility]  12/21/2020 11:00 AM [facility] US1 [facility]    Gita Kudo, MD

## 2020-12-07 ENCOUNTER — Ambulatory Visit: Payer: Self-pay | Attending: Maternal & Fetal Medicine

## 2020-12-07 ENCOUNTER — Other Ambulatory Visit: Payer: Self-pay

## 2020-12-07 ENCOUNTER — Other Ambulatory Visit: Payer: Self-pay | Admitting: *Deleted

## 2020-12-07 ENCOUNTER — Encounter: Payer: Self-pay | Admitting: *Deleted

## 2020-12-07 ENCOUNTER — Ambulatory Visit: Payer: Self-pay | Admitting: *Deleted

## 2020-12-07 DIAGNOSIS — O0990 Supervision of high risk pregnancy, unspecified, unspecified trimester: Secondary | ICD-10-CM

## 2020-12-07 DIAGNOSIS — O209 Hemorrhage in early pregnancy, unspecified: Secondary | ICD-10-CM | POA: Insufficient documentation

## 2020-12-07 DIAGNOSIS — O24419 Gestational diabetes mellitus in pregnancy, unspecified control: Secondary | ICD-10-CM

## 2020-12-08 ENCOUNTER — Other Ambulatory Visit: Payer: Self-pay | Admitting: Obstetrics and Gynecology

## 2020-12-08 DIAGNOSIS — O24415 Gestational diabetes mellitus in pregnancy, controlled by oral hypoglycemic drugs: Secondary | ICD-10-CM

## 2020-12-08 DIAGNOSIS — O09523 Supervision of elderly multigravida, third trimester: Secondary | ICD-10-CM

## 2020-12-08 DIAGNOSIS — O403XX0 Polyhydramnios, third trimester, not applicable or unspecified: Secondary | ICD-10-CM

## 2020-12-13 ENCOUNTER — Encounter: Payer: Self-pay | Admitting: *Deleted

## 2020-12-14 ENCOUNTER — Ambulatory Visit (INDEPENDENT_AMBULATORY_CARE_PROVIDER_SITE_OTHER): Payer: Self-pay | Admitting: Obstetrics & Gynecology

## 2020-12-14 ENCOUNTER — Other Ambulatory Visit: Payer: Self-pay

## 2020-12-14 ENCOUNTER — Ambulatory Visit: Payer: Self-pay | Admitting: *Deleted

## 2020-12-14 ENCOUNTER — Ambulatory Visit: Payer: Self-pay | Attending: Maternal & Fetal Medicine

## 2020-12-14 ENCOUNTER — Encounter: Payer: Self-pay | Admitting: Obstetrics & Gynecology

## 2020-12-14 ENCOUNTER — Encounter: Payer: Self-pay | Admitting: *Deleted

## 2020-12-14 VITALS — BP 110/67 | HR 91 | Wt 163.1 lb

## 2020-12-14 DIAGNOSIS — O0990 Supervision of high risk pregnancy, unspecified, unspecified trimester: Secondary | ICD-10-CM | POA: Insufficient documentation

## 2020-12-14 DIAGNOSIS — O24415 Gestational diabetes mellitus in pregnancy, controlled by oral hypoglycemic drugs: Secondary | ICD-10-CM

## 2020-12-14 DIAGNOSIS — O409XX0 Polyhydramnios, unspecified trimester, not applicable or unspecified: Secondary | ICD-10-CM

## 2020-12-14 DIAGNOSIS — O209 Hemorrhage in early pregnancy, unspecified: Secondary | ICD-10-CM | POA: Insufficient documentation

## 2020-12-14 DIAGNOSIS — O24419 Gestational diabetes mellitus in pregnancy, unspecified control: Secondary | ICD-10-CM | POA: Insufficient documentation

## 2020-12-14 DIAGNOSIS — Z3A35 35 weeks gestation of pregnancy: Secondary | ICD-10-CM

## 2020-12-14 DIAGNOSIS — O34219 Maternal care for unspecified type scar from previous cesarean delivery: Secondary | ICD-10-CM

## 2020-12-14 NOTE — Progress Notes (Signed)
Complains of pressure on pelvic bone that started sometime last week

## 2020-12-14 NOTE — Progress Notes (Unsigned)
   PRENATAL VISIT NOTE  Subjective:  Victoria Richmond is a 35 y.o. G4P3003 at [redacted]w[redacted]d being seen today for ongoing prenatal care.  She is currently monitored for the following issues for this high-risk pregnancy and has Breast pain, left; Previous cesarean delivery affecting pregnancy, antepartum; Gestational diabetes mellitus, antepartum; Bladder laceration as postoperative complication; Vaginal bleeding in pregnancy, first trimester; History of gestational diabetes mellitus; Supervision of high risk pregnancy, antepartum; Fetal macrosomia affecting management of mother, antepartum; and Polyhydramnios affecting pregnancy on their problem list.  Patient reports pelvic pressure.   .  .  Movement: Present. Denies leaking of fluid.   The following portions of the patient's history were reviewed and updated as appropriate: allergies, current medications, past family history, past medical history, past social history, past surgical history and problem list.   Objective:   Vitals:   12/14/20 0903  BP: 110/67  Pulse: 91  Weight: 163 lb 1.6 oz (74 kg)    Fetal Status: Fetal Heart Rate (bpm): 145   Movement: Present     General:  Alert, oriented and cooperative. Patient is in no acute distress.  Skin: Skin is warm and dry. No rash noted.   Cardiovascular: Normal heart rate noted  Respiratory: Normal respiratory effort, no problems with respiration noted  Abdomen: Soft, gravid, appropriate for gestational age.  Pain/Pressure: Present     Pelvic: Cervical exam deferred        Extremities: Normal range of motion.  Edema: None  Mental Status: Normal mood and affect. Normal behavior. Normal judgment and thought content.   Assessment and Plan:  Pregnancy: G4P3003 at [redacted]w[redacted]d 1. Polyhydramnios affecting pregnancy 31 cm AFI  2. Previous cesarean delivery affecting pregnancy, antepartum 39.1 week CS scheduled   3. Gestational diabetes mellitus (GDM) controlled on oral hypoglycemic drug,  antepartum Good control on metformin  Preterm labor symptoms and general obstetric precautions including but not limited to vaginal bleeding, contractions, leaking of fluid and fetal movement were reviewed in detail with the patient. Please refer to After Visit Summary for other counseling recommendations.   Return in about 1 week (around 12/21/2020).  Future Appointments  Date Time Provider Department Center  12/21/2020  9:00 AM Adam Phenix, MD [facility]  12/21/2020 10:45 AM [facility] NURSE [facility]  12/21/2020 11:00 AM [facility] US1 [facility]  12/29/2020 10:30 AM [facility] NURSE [facility]  12/29/2020 10:45 AM [facility] US5 [facility]  01/04/2021 10:30 AM [facility] NURSE [facility]  01/04/2021 10:45 AM [facility] US5 [facility]    Scheryl Darter, MD

## 2020-12-14 NOTE — Patient Instructions (Signed)
Parto por cesrea Cesarean Delivery El nacimiento o parto por cesrea es el parto quirrgico de un beb a travs de una incisin en el abdomen y Careers information officer. Se lo conoce como cesrea. Este procedimiento se puede programar con anticipacin o se puede Publishing copy una situacin de Associate Professor. Informe al mdico acerca de lo siguiente:  Cualquier alergia que tenga.  Todos los [employer], incluidos vitaminas, hierbas, gotas oftlmicas, cremas y [address] de [address].  Cualquier problema previo que usted o algn miembro de su familia haya tenido con los anestsicos.  Cualquier trastorno de la sangre que tenga.  Cirugas a las que se haya sometido.  Cualquier afeccin mdica que tenga.  Si usted o Research scientist (physical sciences) tiene antecedentes de trombosis venosa profunda (TVP) o embolia pulmonar (EP). Cules son los riesgos? En general, se trata de un procedimiento seguro. Sin embargo, pueden ocurrir complicaciones, por ejemplo:  Infeccin.  Sangrado.  Reacciones alrgicas a los medicamentos.  Daos a otras estructuras u rganos.  Cogulos de [place].  Lesiones al beb. Qu ocurre antes del procedimiento? Indicaciones generales  Siga las indicaciones del mdico respecto de las restricciones en las comidas o las bebidas.  Si sabe que va a tener un parto por cesrea, no se rasure la zona pbica. Rasurarse antes del procedimiento puede aumentar el riesgo de infeccin.  Pdale a alguien que la lleve a su casa desde el hospital.  Pregntele al mdico qu medidas se tomarn para prevenir una infeccin. Estas pueden incluir: ? Rasurar el vello del lugar de la ciruga. ? Lavar la piel con un jabn antisptico. ? Suministrar antibiticos.  Segn el motivo del parto por cesrea, es posible que se le realice un examen fsico o una prueba adicional, como una ecografa.  Posiblemente le realicen un anlisis de [place] u [place]. Preguntas para hacerle al mdico  Consulte al mdico  sobre: ? Multimedia programmer o suspender los medicamentos que toma habitualmente. Esto es muy importante si toma medicamentos para la diabetes o anticoagulantes. ? El plan para Human resources officer. Esto es muy importante si piensa amamantar a su beb. ? Designer, industrial/product hospital despus del procedimiento. ? Cualquier inquietud que pueda tener sobre recibir hemoderivados, si los necesita durante el procedimiento. ? Almacenamiento de sangre del cordn, si planea guardar la sangre del cordn umbilical del beb.  Quiz tambin desee preguntarle al mdico lo siguiente: ? Si va a poder Publishing rights manager a su beb mientras an se encuentre en el quirfano. ? Si su beb puede quedarse con usted inmediatamente despus del procedimiento y durante su recuperacin. ? Si un familiar o la persona que usted elija puede ingresar al quirfano y Personal assistant con usted durante el procedimiento, inmediatamente despus de este y durante su recuperacin. Qu ocurre durante el procedimiento?  Le colocarn una va intravenosa en una vena.  Le administrarn lquido y [address], tales como antibiticos, antes de la [place].  Le colocarn monitores fetales en el abdomen para controlar la frecuencia cardaca de su beb.  Se le puede entregar una bata especial de calentamiento para mantener estable la temperatura corporal.  Se le puede insertar un catter en la vejiga a travs de la uretra. Esto se hace para drenar la orina durante el procedimiento.  Pueden administrarle uno o ms de los siguientes medicamentos: ? Un medicamento para adormecer la zona (anestesia local). ? Un medicamento que la har dormir (anestesia general). ? Un medicamento (anestesia regional) que se le inyecta en la espalda o a travs  de un tubo pequeo y delgado que se le coloca en la espalda (anestesia raqudea o anestesia epidural). Esto adormece la parte del cuerpo que est por debajo del sitio de la inyeccin y le permite permanecer  despierta durante el procedimiento. Si llega a sentir nuseas, dgaselo al mdico. Tendr medicamentos disponibles para ayudarla a reducir cualquier nusea que pueda sentir.  Le harn una incisin en el abdomen y luego en el tero.  Si est despierta durante el procedimiento, puede sentir tirones en el abdomen, pero no debera sentir dolor. Si siente dolor, dgaselo al mdico inmediatamente.  Se sacar al beb del tero. Es posible que sienta ms presin o un tirn, mientras esto sucede.  Inmediatamente despus del nacimiento, se secar al beb y se lo mantendr caliente. Podr sostener y amamantar a su beb.  Durante este momento, es posible que se pince y corte el cordn umbilical. Esto ocurre por lo general despus de un perodo de 1 a 2 minutos despus del parto.  Se le extraer la placenta del tero.  Las incisiones se cerrarn con puntos (suturas). Es posible que se apliquen grapas, goma para cerrar la piel o tiras adhesivas en la incisin del abdomen.  Pueden colocarle vendas (vendajes) sobre la incisin del abdomen. El procedimiento puede variar segn el mdico y el hospital.   Qu ocurre despus del procedimiento?  Le controlarn la presin arterial, la frecuencia cardaca, la frecuencia respiratoria y el nivel de oxgeno en la sangre hasta que le den el alta del hospital.  Puede seguir recibiendo lquidos o medicamentos por va intravenosa.  Sentir un poco de dolor. Tendr analgsicos disponibles para ayudarla a controlar el dolor.  Para evitar la formacin de cogulos de sangre: ? Se le pueden administrar medicamentos. ? Quizs deba usar medias o dispositivos de compresin. ? Se le indicar que camine cuando pueda hacerlo.  El personal del hospital alentar y apoyar que cree lazos con su beb. En el hospital, es posible que le permitan que el beb permanezca en la misma habitacin que usted (internacin conjunta) durante la hospitalizacin para fomentar la creacin del  vnculo y un amamantamiento exitoso.  Se le puede sugerir que tosa y respire profundamente con frecuencia. Esto ayuda a evitar problemas pulmonares.  Si tiene un catter que le drena la orina, se le quitar lo antes posible despus del procedimiento. Resumen  El nacimiento o parto por cesrea es el parto quirrgico de un beb a travs de una incisin en el abdomen y el tero.  Siga las indicaciones del mdico con respecto a las restricciones de comidas o bebidas antes del procedimiento.  Sentir algo de dolor despus del procedimiento. Tendr analgsicos disponibles para ayudarla a controlar el dolor.  El personal del hospital alentar y apoyar que cree lazos con su beb despus del procedimiento. En el hospital, es posible que le permitan que el beb permanezca en la misma habitacin que usted (internacin conjunta) durante la hospitalizacin para fomentar la creacin del vnculo y un amamantamiento exitoso. Esta informacin no tiene como fin reemplazar el consejo del mdico. Asegrese de hacerle al mdico cualquier pregunta que tenga. Document Revised: 07/19/2020 Document Reviewed: 05/07/2018 Elsevier Patient Education  2021 Elsevier Inc.  

## 2020-12-15 ENCOUNTER — Ambulatory Visit (INDEPENDENT_AMBULATORY_CARE_PROVIDER_SITE_OTHER): Payer: Self-pay

## 2020-12-15 ENCOUNTER — Other Ambulatory Visit (HOSPITAL_COMMUNITY)
Admission: RE | Admit: 2020-12-15 | Discharge: 2020-12-15 | Disposition: A | Discharge status: Home / Self Care | Payer: Self-pay | Source: Ambulatory Visit | Attending: Family Medicine | Admitting: Family Medicine

## 2020-12-15 ENCOUNTER — Telehealth: Payer: Self-pay | Admitting: General Practice

## 2020-12-15 VITALS — BP 107/70 | HR 89 | Wt 163.9 lb

## 2020-12-15 DIAGNOSIS — N898 Other specified noninflammatory disorders of vagina: Secondary | ICD-10-CM | POA: Insufficient documentation

## 2020-12-15 DIAGNOSIS — O0990 Supervision of high risk pregnancy, unspecified, unspecified trimester: Secondary | ICD-10-CM

## 2020-12-15 DIAGNOSIS — N949 Unspecified condition associated with female genital organs and menstrual cycle: Secondary | ICD-10-CM

## 2020-12-15 MED ORDER — TERCONAZOLE 0.4 % VA CREA: 1.0000 | TOPICAL_CREAM | Freq: Every day | VAGINAL | 0 refills | Status: DC

## 2020-12-15 NOTE — Telephone Encounter (Signed)
   Victoria Richmond DOB: 07/14/1986 MRN: [id number]   RIDER WAIVER AND RELEASE OF LIABILITY  For purposes of improving physical access to our facilities, [facility] is pleased to partner with third parties to provide [facility] patients or other authorized individuals the option of convenient, on-demand ground transportation services (the [employer]") through use of the technology service that enables users to request on-demand ground transportation from independent third-party providers.  By opting to use and accept these [employer], I, the undersigned, hereby agree on behalf of myself, and on behalf of any minor child using the [employer] for whom I am the parent or legal guardian, as follows:  1. Science writer provided to me are provided by independent third-party transportation providers who are not [employer] or employees and who are unaffiliated with [employer]. 2. [place] is neither a transportation carrier nor a common or public carrier. 3. [facility] has no control over the quality or safety of the transportation that occurs as a result of the [employer]. 4. [facility] cannot guarantee that any third-party transportation provider will complete any arranged transportation service. 5. [facility] makes no representation, warranty, or guarantee regarding the reliability, timeliness, quality, safety, suitability, or availability of any of the Transport Services or that they will be error free. 6. I fully understand that traveling by vehicle involves risks and dangers of serious bodily injury, including permanent disability, paralysis, and death. I agree, on behalf of myself and on behalf of any minor child using the Transport Services for whom I am the parent or legal guardian, that the entire risk arising out of my use of the [employer] remains solely with me, to the maximum extent permitted under applicable law. 7. The  [employer] are provided "as is" and "as available." [facility] disclaims all representations and warranties, express, implied or statutory, not expressly set out in these terms, including the implied warranties of merchantability and fitness for a particular purpose. 8. I hereby waive and release [place], its agents, employees, officers, directors, representatives, insurers, attorneys, assigns, successors, subsidiaries, and affiliates from any and all past, present, or future claims, demands, liabilities, actions, causes of action, or suits of any kind directly or indirectly arising from acceptance and use of the [employer]. 9. I further waive and release [facility] and its affiliates from all present and future liability and responsibility for any injury or death to persons or damages to property caused by or related to the use of the [employer]. 10. I have read this Waiver and Release of Liability, and I understand the terms used in it and their legal significance. This Waiver is freely and voluntarily given with the understanding that my right (as well as the right of any minor child for whom I am the parent or legal guardian using the [employer]) to legal recourse against [place] in connection with the [employer] is knowingly surrendered in return for use of these services.   I attest that I read the consent document to Victoria Richmond, gave Victoria Richmond the opportunity to ask questions and answered the questions asked (if any). I affirm that Victoria Richmond then provided consent for she's participation in this program.      Victoria Richmond, read to patient in Victoria Richmond

## 2020-12-15 NOTE — Progress Notes (Signed)
Here today with complaint of vaginal itching and burning that began yesterday afternoon. Denies any change with urination. Denies any vaginal odor. Reports using OTC Monistat last night with some relief. UA today is unremarkable. Self-swab collected. Explained to pt this sounds like a yeast infection but we will send swab for testing to be sure. Rx sent for Terazol 7 and good rx coupon provided; explained pt can continue Monistat or begin Terazol. Explained pt will be called with any abnormal results. Interpreter Raquel present for full encounter.  Fleet Contras RN 12/15/20

## 2020-12-16 NOTE — Progress Notes (Signed)
Chart reviewed for nurse visit. Agree with plan of care.   Venora Maples, MD 12/16/20 10:59 AM

## 2020-12-17 LAB — CERVICOVAGINAL ANCILLARY ONLY
Bacterial Vaginitis (gardnerella): NEGATIVE
Candida Glabrata: NEGATIVE
Candida Vaginitis: NEGATIVE
Chlamydia: NEGATIVE
Comment: NEGATIVE
Comment: NEGATIVE
Comment: NEGATIVE
Comment: NEGATIVE
Comment: NEGATIVE
Comment: NORMAL
Neisseria Gonorrhea: NEGATIVE
Trichomonas: NEGATIVE

## 2020-12-21 ENCOUNTER — Ambulatory Visit (INDEPENDENT_AMBULATORY_CARE_PROVIDER_SITE_OTHER): Payer: Self-pay | Admitting: Obstetrics and Gynecology

## 2020-12-21 ENCOUNTER — Ambulatory Visit: Payer: Self-pay | Admitting: *Deleted

## 2020-12-21 ENCOUNTER — Ambulatory Visit: Payer: Self-pay | Attending: Maternal & Fetal Medicine

## 2020-12-21 ENCOUNTER — Other Ambulatory Visit: Payer: Self-pay

## 2020-12-21 ENCOUNTER — Encounter: Payer: Self-pay | Admitting: *Deleted

## 2020-12-21 ENCOUNTER — Other Ambulatory Visit (HOSPITAL_COMMUNITY)
Admission: RE | Admit: 2020-12-21 | Discharge: 2020-12-21 | Disposition: A | Discharge status: Home / Self Care | Payer: Self-pay | Source: Ambulatory Visit | Attending: Obstetrics & Gynecology | Admitting: Obstetrics & Gynecology

## 2020-12-21 ENCOUNTER — Encounter: Payer: Self-pay | Admitting: Obstetrics & Gynecology

## 2020-12-21 VITALS — BP 109/71 | HR 85 | Wt 164.1 lb

## 2020-12-21 DIAGNOSIS — O403XX0 Polyhydramnios, third trimester, not applicable or unspecified: Secondary | ICD-10-CM

## 2020-12-21 DIAGNOSIS — O409XX0 Polyhydramnios, unspecified trimester, not applicable or unspecified: Secondary | ICD-10-CM

## 2020-12-21 DIAGNOSIS — O34219 Maternal care for unspecified type scar from previous cesarean delivery: Secondary | ICD-10-CM

## 2020-12-21 DIAGNOSIS — O09293 Supervision of pregnancy with other poor reproductive or obstetric history, third trimester: Secondary | ICD-10-CM

## 2020-12-21 DIAGNOSIS — O24415 Gestational diabetes mellitus in pregnancy, controlled by oral hypoglycemic drugs: Secondary | ICD-10-CM

## 2020-12-21 DIAGNOSIS — O0990 Supervision of high risk pregnancy, unspecified, unspecified trimester: Secondary | ICD-10-CM | POA: Insufficient documentation

## 2020-12-21 DIAGNOSIS — O3660X0 Maternal care for excessive fetal growth, unspecified trimester, not applicable or unspecified: Secondary | ICD-10-CM

## 2020-12-21 DIAGNOSIS — O24419 Gestational diabetes mellitus in pregnancy, unspecified control: Secondary | ICD-10-CM

## 2020-12-21 DIAGNOSIS — Z3A36 36 weeks gestation of pregnancy: Secondary | ICD-10-CM

## 2020-12-21 DIAGNOSIS — O09523 Supervision of elderly multigravida, third trimester: Secondary | ICD-10-CM

## 2020-12-21 DIAGNOSIS — O209 Hemorrhage in early pregnancy, unspecified: Secondary | ICD-10-CM

## 2020-12-21 DIAGNOSIS — N9989 Other postprocedural complications and disorders of genitourinary system: Secondary | ICD-10-CM

## 2020-12-21 LAB — POCT URINALYSIS DIP (DEVICE)
Bilirubin Urine: NEGATIVE
Glucose, UA: NEGATIVE mg/dL
Hgb urine dipstick: NEGATIVE
Ketones, ur: NEGATIVE mg/dL
Leukocytes,Ua: NEGATIVE
Nitrite: NEGATIVE
Protein, ur: NEGATIVE mg/dL
Specific Gravity, Urine: 1.015 (ref 1.005–1.030)
Urobilinogen, UA: 0.2 mg/dL (ref 0.0–1.0)
pH: 5.5 (ref 5.0–8.0)

## 2020-12-21 NOTE — Patient Instructions (Signed)
Diabetes mellitus gestacional, cuidados personales Gestational Diabetes Mellitus, Self-Care Las mujeres que tienen diabetes mellitus gestacional deben mantener su nivel de azcar en la sangre (glucosa) dentro de lmites saludables. Cules son los riesgos? Si no recibe tratamiento, esta afeccin puede causar problemas a usted y a su beb en gestacin. Para la mam  Dar a luz al beb de manera temprana.  Tener problemas durante el [place] de parto y al dar a Actuary.  Necesitar una ciruga para dar a luz al beb (parto por cesrea).  Tener problemas con la presin arterial.  Tener esta forma de diabetes de nuevo al estar embarazada.  Desarrollar diabetes tipo2 en el futuro. Para el beb  [employer] de Dispensing optician.  Tamao corporal ms grande que lo normal.  Problemas respiratorios. Cmo International aid/development worker su nivel de azcar en la sangre todos los das durante el [place]. Contrlelo con la frecuencia que le haya indicado el mdico. Para hacer esto: 1. Lvese las manos con agua y [place] durante al menos 20segundos. 2. Pnchese el costado del dedo (no en la punta) con la lanceta. Use un dedo diferente cada vez. 3. Frote suavemente el dedo hasta que aparezca una pequea gota de [place]. 4. Siga las instrucciones que vienen con el medidor para: ? Materials engineer. ? Poner sangre en la tira reactiva. ? Obtener el resultado. 5. Registre su resultado y las observaciones que desee. En general, sus niveles de azcar en la sangre deben ser los siguientes:  95 mg/dl (5.3 mmol/l) si no ha comido.  140 mg/dl (7.8 mmol/l) 1 hora despus de una comida.  120 mg/dl (6.7 mmol/l) 2 horas despus de una comida.   Siga estas instrucciones en su casa: Medicamentos  Use los medicamentos de venta libre y los recetados solamente como se lo haya indicado el mdico.  Si el mdico le recet insulina u otros medicamentos para la diabetes, aplquesela o tmelos  todos los das: ? Aplquese o tome los medicamentos todos [place]. ? No se quede sin insulina o sin otros medicamentos. Planifique con antelacin para tenerlos siempre. Comida y bebida  Siga las instrucciones del mdico respecto de las restricciones en las comidas o las bebidas.  Consulte a un experto en alimentacin (nutricionista) que le ayude a crear un plan de alimentacin para Forensic scientist. Los alimentos de este plan deben incluir lo siguiente: ? Protenas con bajo contenido de [place]. ? Frijoles secos, frutos secos, y panes, cereales o pasta integrales. ? Lambert Mody y verduras frescas. ? Productos lcteos con bajo contenido de grasa. ? Grasas saludables.  Ingiera refrigerios saludables entre comidas nutritivas.  Beba suficiente lquido para Contractor pis (orina) de color amarillo plido.  Lleve un registro de los hidratos de carbono que consume. Para hacer esto: ? Lea las etiquetas de los alimentos. ? Aprenda cules son los tamaos de las porciones de los alimentos.  Siga su plan para los das de enfermedad cuando no pueda comer ni beber normalmente. [employer] plan con el mdico, de modo que est listo para usarlo.   Actividad  [employer] ejercicios como se lo haya indicado el mdico.  Haga actividad fsica durante 38minutos o ms por da, o durante el tiempo que el [employer] recomiende. Puede ayudar a Environmental consultant de azcar en la sangre despus de una comida si: ? Hace 10 minutos de actividad fsica despus de cada comida. ? Comienza esa actividad fsica 30 minutos  despus de la comida.  Hable con el mdico antes de comenzar una rutina de ejercicio nueva. Es posible que el mdico le diga que haga cambios en la insulina, otros medicamentos o los alimentos. Estilo de vida  No beba alcohol.  No consuma ningn producto que contenga nicotina o tabaco, como cigarrillos, cigarrillos electrnicos y tabaco de mascar. Si necesita ayuda para dejar de  consumir estos productos, consulte al mdico.  Aprenda cmo sobrellevar el estrs. Si necesita ayuda para lograrlo, consulte al mdico. Cuidado del cuerpo  Mantenga las vacunas al da.  Cudese bien los dientes. Para hacer esto: ? Cepllese los dientes y las encas dos veces al da. ? Psese hilo dental una o ms veces por da. ? Vaya al dentista una vez cada 6meses o con ms frecuencia.  Mantenga un peso saludable durante el embarazo. Indicaciones generales  Pregntele al mdico sobre los riesgos de la hipertensin arterial en el embarazo.  Comparta su plan de atencin de la diabetes con: ? Sus compaeros de trabajo o de la escuela. ? Las personas con las que convive.  Hgase pruebas de orina para detectar la presencia de cetonas: ? Cuando est enferma. ? Como se lo haya indicado el mdico.  Lleve consigo una tarjeta, o use un brazalete que diga que tiene diabetes.  Cumpla con todas las visitas de seguimiento. Cuidados despus del parto  Hgase controlar el nivel de azcar en la sangre 4 a 12semanas despus del parto.  Hgase controlar si tiene diabetes una o ms veces cada 3 aos o segn le hayan indicado. Dnde buscar ms informacin  American Diabetes Association (ADA) (Asociacin Estadounidense de la Diabetes): diabetes.org  Association of Diabetes Care & Education Specialists (ADCES) (Asociacin de Especialistas en Atencin y Educacin sobre la Diabetes): diabeteseducator.org  Centers for Disease Control and Prevention (Centros para el Control y la Prevencin de Enfermedades, CDC): cdc.gov  American Pregnancy Association (Asociacin Americana del Embarazo): americanpregnancy.org  U.S. Department of Agriculture MyPlate (MyPlate del Departamento de Agricultura de los EE.UU.): myplate.gov Comunquese con un mdico si:  Su azcar en la sangre est por encima de su valor ideal en dospruebas consecutivas.  Tiene fiebre.  Ha estado enferma durante 2 o ms das y  no mejora.  Tiene cualquiera de estos problemas durante ms de 6horas: ? Vomita cada vez que come o bebe. ? Presenta heces lquidas (diarrea). Solicite ayuda de inmediato si:  No puede pensar con claridad.  Tiene dificultad para respirar.  Tiene un nivel moderado o alto de cetonas en la orina.  Le comienza a salir lquido anmalo o sangre de la vagina.  Siente que el beb no se mueve tanto como es habitual.  Comienza a tener contracciones de manera temprana. Siente que el vientre se endurece.  Tiene un dolor de cabeza muy intenso. Estos sntomas pueden indicar una emergencia. Solicite ayuda de inmediato. Comunquese con el servicio de emergencias de su localidad (911 en los [place]).  No espere a ver si los sntomas desaparecen.  No conduzca por sus propios medios hasta el hospital. Resumen  Controle su azcar en la sangre (glucosa) mientras est embarazada. Contrlelo con la frecuencia que le haya indicado el mdico.  Aplquese la insulina y tome los medicamentos para la diabetes como se lo hayan indicado.  Hgase controlar el nivel de azcar en la sangre 4 a 12semanas despus del parto.  Cumpla con todas las visitas de seguimiento. Esta informacin no tiene como fin reemplazar el consejo del mdico. Asegrese de hacerle   al mdico cualquier pregunta que tenga. Document Revised: 04/25/2020 Document Reviewed: 04/25/2020 Elsevier Patient Education  [place].

## 2020-12-21 NOTE — Progress Notes (Signed)
States never took flagyl Victoria [place],RN

## 2020-12-21 NOTE — Progress Notes (Signed)
   PRENATAL VISIT NOTE  Subjective:  Victoria Richmond is a 35 y.o. G4P3003 at [redacted]w[redacted]d being seen today for ongoing prenatal care.  She is currently monitored for the following issues for this high-risk pregnancy and has Breast pain, left; Previous cesarean delivery affecting pregnancy, antepartum; Gestational diabetes mellitus, antepartum; Bladder laceration as postoperative complication; Vaginal bleeding in pregnancy, first trimester; History of gestational diabetes mellitus; Supervision of high risk pregnancy, antepartum; Fetal macrosomia affecting management of mother, antepartum; and Polyhydramnios affecting pregnancy on their problem list.  Patient doing well with no acute concerns today. She reports no complaints.  Contractions: Not present. Vag. Bleeding: None.  Movement: Present. Denies leaking of fluid.  FBS: 61-93 PPBS: 77-121 The following portions of the patient's history were reviewed and updated as appropriate: allergies, current medications, past family history, past medical history, past social history, past surgical history and problem list. Problem list updated.  Objective:   Vitals:   12/21/20 0931  BP: 109/71  Pulse: 85  Weight: 164 lb 1.6 oz (74.4 kg)    Fetal Status: Fetal Heart Rate (bpm): 126 Fundal Height: 37 cm Movement: Present     General:  Alert, oriented and cooperative. Patient is in no acute distress.  Skin: Skin is warm and dry. No rash noted.   Cardiovascular: Normal heart rate noted  Respiratory: Normal respiratory effort, no problems with respiration noted  Abdomen: Soft, gravid, appropriate for gestational age.  Pain/Pressure: Present     Pelvic: Cervical exam performed Dilation: Closed Effacement (%): Thick Station: -3  Extremities: Normal range of motion.  Edema: None  Mental Status:  Normal mood and affect. Normal behavior. Normal judgment and thought content.   Assessment and Plan:  Pregnancy: G4P3003 at [redacted]w[redacted]d  1. Gestational diabetes  mellitus (GDM), antepartum, gestational diabetes method of control unspecified Excellent blood sugar control with metformin  2. Supervision of high risk pregnancy, antepartum  - GC/Chlamydia probe amp ([place])not at [facility] - Culture, beta strep (group b only)  3. Bladder laceration as postoperative complication Per UA needs vertical skin and uterine incision  4. Fetal macrosomia affecting management of mother, antepartum Fetal scan today  5. Polyhydramnios affecting pregnancy   6. Previous cesarean delivery affecting pregnancy, antepartum Repeat c/s with tubal scheduled  Term labor symptoms and general obstetric precautions including but not limited to vaginal bleeding, contractions, leaking of fluid and fetal movement were reviewed in detail with the patient.  Please refer to After Visit Summary for other counseling recommendations.   Return in about 1 week (around 12/28/2020) for [facility], in person.   Mariel Aloe, MD Faculty Attending Center for [facility]

## 2020-12-22 LAB — GC/CHLAMYDIA PROBE AMP (~~LOC~~) NOT AT ARMC
Chlamydia: NEGATIVE
Comment: NEGATIVE
Comment: NORMAL
Neisseria Gonorrhea: NEGATIVE

## 2020-12-24 LAB — CULTURE, BETA STREP (GROUP B ONLY): Strep Gp B Culture: NEGATIVE

## 2020-12-25 ENCOUNTER — Encounter (HOSPITAL_COMMUNITY): Payer: Self-pay

## 2020-12-25 NOTE — Patient Instructions (Addendum)
Victoria Richmond  12/25/2020   Your procedure is scheduled on:  01/08/2021  Arrive at 0930 at [employer] [facility] on [address] at [facility]  and [employer]. You are invited to use the FREE valet parking or use the Visitor's parking deck.  Pick up the phone at the desk and dial [phone].  Call this number if you have problems the morning of surgery: [phone]  Remember:   Do not eat food:(After Midnight) Desps de medianoche.  Do not drink clear liquids: (After Midnight) Desps de medianoche.  Take these medicines the morning of surgery with A SIP OF WATER:  none   Do not wear jewelry, make-up or nail polish.  Do not wear lotions, powders, or perfumes. Do not wear deodorant.  Do not shave 48 hours prior to surgery.  Do not bring valuables to the hospital.  [facility] is not   responsible for any belongings or valuables brought to the hospital.  Contacts, dentures or bridgework may not be worn into surgery.  Leave suitcase in the car. After surgery it may be brought to your room.  For patients admitted to the hospital, checkout time is 11:00 AM the day of              discharge.      Please read over the following fact sheets that you were given:     Preparing for Surgery                                          Instrucciones Para Antes de la Ciruga   Su ciruga est programada para 01/08/2021  (your procedure is scheduled on) Entre por la entrada principal del [facility]  a las 0930 de la [place] -(enter through the main entrance at [facility] at [employer] AM    [employer] telfono, [place] [address] para informarnos de su llegada. (pick up phone, dial [phone] on arrival)     Por favor llame al [phone] si tiene algn problema en la maana de la ciruga (please call this number if you have any problems the morning of surgery.)                  Recuerde: (Remember)  No coma alimentos. (Do not eat food (After Midnight) Desps de medianoche)    No tome  lquidos claros. (Do not drink clear liquids (After Midnight) Desps de medianoche)    No use joyas, maquillaje de ojos, lpiz labial, crema para el cuerpo o esmalte de uas oscuro. (Do not wear jewelry, eye makeup, lipstick, body lotion, or dark fingernail polish). Puede usar desodorante (you may wear deodorant)    No se afeite 48 horas de su ciruga. (Do not shave 48 hours before your surgery)    No traiga objetos de valor al hospital.  [facility] no se hace responsable de ninguna pertenencia, ni objetos de valor que haya trado al hospital. (Do not bring valuable to the hospital.  [facility] is not responsible for any belongings or valuables brought to the hospital)   [facility] medicinas en la maana de la ciruga con un SORBITO de agua nada (take these meds the morning of surgery with a SIP of water)     Durante la ciruga no se pueden usar lentes de contacto, dentaduras o puentes. (Contacts, dentures or bridgework cannot be worn in surgery).   Si  va a ser ingresado despus de la ciruga, deje la [employer] en el carro [employer] se le haya asignado una habitacin. (If you are to be admitted after surgery, leave suitcase in car until your room has been assigned.)   A los pacientes que se les d de alta el mismo da no se les permitir manejar a casa.  (Patients discharged on the day of surgery will not be allowed to drive home)    [place] y nmero de telfono del Programmer, multimedia na. (Name and telephone number of your driver)   Instrucciones especiales N/A (Special Instructions)   Por favor, lea las hojas informativas que le entregaron. (Please read over the following fact sheets that you were given) Surgical Site Infection Prevention

## 2020-12-25 NOTE — Pre-Procedure Instructions (Signed)
Interpreter number [phone]

## 2020-12-29 ENCOUNTER — Ambulatory Visit: Payer: Self-pay | Admitting: *Deleted

## 2020-12-29 ENCOUNTER — Other Ambulatory Visit: Payer: Self-pay | Admitting: Obstetrics and Gynecology

## 2020-12-29 ENCOUNTER — Other Ambulatory Visit: Payer: Self-pay

## 2020-12-29 ENCOUNTER — Encounter: Payer: Self-pay | Admitting: *Deleted

## 2020-12-29 ENCOUNTER — Ambulatory Visit: Payer: Self-pay | Attending: Obstetrics and Gynecology

## 2020-12-29 ENCOUNTER — Ambulatory Visit (INDEPENDENT_AMBULATORY_CARE_PROVIDER_SITE_OTHER): Payer: Self-pay | Admitting: Obstetrics and Gynecology

## 2020-12-29 VITALS — BP 106/71 | HR 86 | Wt 170.7 lb

## 2020-12-29 DIAGNOSIS — O24419 Gestational diabetes mellitus in pregnancy, unspecified control: Secondary | ICD-10-CM

## 2020-12-29 DIAGNOSIS — O34219 Maternal care for unspecified type scar from previous cesarean delivery: Secondary | ICD-10-CM

## 2020-12-29 DIAGNOSIS — O0990 Supervision of high risk pregnancy, unspecified, unspecified trimester: Secondary | ICD-10-CM | POA: Insufficient documentation

## 2020-12-29 DIAGNOSIS — O24415 Gestational diabetes mellitus in pregnancy, controlled by oral hypoglycemic drugs: Secondary | ICD-10-CM

## 2020-12-29 DIAGNOSIS — O403XX0 Polyhydramnios, third trimester, not applicable or unspecified: Secondary | ICD-10-CM

## 2020-12-29 DIAGNOSIS — O09523 Supervision of elderly multigravida, third trimester: Secondary | ICD-10-CM

## 2020-12-29 DIAGNOSIS — O3660X0 Maternal care for excessive fetal growth, unspecified trimester, not applicable or unspecified: Secondary | ICD-10-CM

## 2020-12-29 DIAGNOSIS — O209 Hemorrhage in early pregnancy, unspecified: Secondary | ICD-10-CM | POA: Insufficient documentation

## 2020-12-29 DIAGNOSIS — Z3A37 37 weeks gestation of pregnancy: Secondary | ICD-10-CM

## 2020-12-29 DIAGNOSIS — O409XX0 Polyhydramnios, unspecified trimester, not applicable or unspecified: Secondary | ICD-10-CM

## 2020-12-29 DIAGNOSIS — N9989 Other postprocedural complications and disorders of genitourinary system: Secondary | ICD-10-CM

## 2020-12-29 LAB — POCT URINALYSIS DIP (DEVICE)
Bilirubin Urine: NEGATIVE
Glucose, UA: NEGATIVE mg/dL
Hgb urine dipstick: NEGATIVE
Ketones, ur: NEGATIVE mg/dL
Leukocytes,Ua: NEGATIVE
Nitrite: NEGATIVE
Protein, ur: NEGATIVE mg/dL
Specific Gravity, Urine: 1.015 (ref 1.005–1.030)
Urobilinogen, UA: 0.2 mg/dL (ref 0.0–1.0)
pH: 7 (ref 5.0–8.0)

## 2020-12-29 NOTE — Patient Instructions (Signed)
Parto por cesrea Cesarean Delivery El nacimiento o parto por cesrea es el parto quirrgico de un beb a travs de una incisin en el abdomen y Careers information officer. Se lo conoce como cesrea. Este procedimiento se puede programar con anticipacin o se puede Publishing copy una situacin de Associate Professor. Informe al mdico acerca de lo siguiente:  Cualquier alergia que tenga.  Todos los [employer], incluidos vitaminas, hierbas, gotas oftlmicas, cremas y [address] de [address].  Cualquier problema previo que usted o algn miembro de su familia haya tenido con los anestsicos.  Cualquier trastorno de la sangre que tenga.  Cirugas a las que se haya sometido.  Cualquier afeccin mdica que tenga.  Si usted o Research scientist (physical sciences) tiene antecedentes de trombosis venosa profunda (TVP) o embolia pulmonar (EP). Cules son los riesgos? En general, se trata de un procedimiento seguro. Sin embargo, pueden ocurrir complicaciones, por ejemplo:  Infeccin.  Sangrado.  Reacciones alrgicas a los medicamentos.  Daos a otras estructuras u rganos.  Cogulos de [place].  Lesiones al beb. Qu ocurre antes del procedimiento? Indicaciones generales  Siga las indicaciones del mdico respecto de las restricciones en las comidas o las bebidas.  Si sabe que va a tener un parto por cesrea, no se rasure la zona pbica. Rasurarse antes del procedimiento puede aumentar el riesgo de infeccin.  Pdale a alguien que la lleve a su casa desde el hospital.  Pregntele al mdico qu medidas se tomarn para prevenir una infeccin. Estas pueden incluir: ? Rasurar el vello del lugar de la ciruga. ? Lavar la piel con un jabn antisptico. ? Suministrar antibiticos.  Segn el motivo del parto por cesrea, es posible que se le realice un examen fsico o una prueba adicional, como una ecografa.  Posiblemente le realicen un anlisis de [place]. Preguntas para hacerle al mdico  Consulte al mdico  sobre: ? Multimedia programmer o suspender los medicamentos que toma habitualmente. Esto es muy importante si toma medicamentos para la diabetes o anticoagulantes. ? El plan para Human resources officer. Esto es muy importante si piensa amamantar a su beb. ? Designer, industrial/product hospital despus del procedimiento. ? Cualquier inquietud que pueda tener sobre recibir hemoderivados, si los necesita durante el procedimiento. ? Almacenamiento de sangre del cordn, si planea guardar la sangre del cordn umbilical del beb.  Quiz tambin desee preguntarle al mdico lo siguiente: ? Si va a poder Publishing rights manager a su beb mientras an se encuentre en el quirfano. ? Si su beb puede quedarse con usted inmediatamente despus del procedimiento y durante su recuperacin. ? Si un familiar o la persona que usted elija puede ingresar al quirfano y Personal assistant con usted durante el procedimiento, inmediatamente despus de este y durante su recuperacin. Qu ocurre durante el procedimiento?  Le colocarn una va intravenosa en una vena.  Le administrarn lquido y [address], tales como antibiticos, antes de la [place].  Le colocarn monitores fetales en el abdomen para controlar la frecuencia cardaca de su beb.  Se le puede entregar una bata especial de calentamiento para mantener estable la temperatura corporal.  Se le puede insertar un catter en la vejiga a travs de la uretra. Esto se hace para drenar la orina durante el procedimiento.  Pueden administrarle uno o ms de los siguientes medicamentos: ? Un medicamento para adormecer la zona (anestesia local). ? Un medicamento que la har dormir (anestesia general). ? Un medicamento (anestesia regional) que se le inyecta en la espalda o a travs  de un tubo pequeo y delgado que se le coloca en la espalda (anestesia raqudea o anestesia epidural). Esto adormece la parte del cuerpo que est por debajo del sitio de la inyeccin y le permite permanecer  despierta durante el procedimiento. Si llega a sentir nuseas, dgaselo al mdico. Tendr medicamentos disponibles para ayudarla a reducir cualquier nusea que pueda sentir.  Le harn una incisin en el abdomen y luego en el tero.  Si est despierta durante el procedimiento, puede sentir tirones en el abdomen, pero no debera sentir dolor. Si siente dolor, dgaselo al mdico inmediatamente.  Se sacar al beb del tero. Es posible que sienta ms presin o un tirn, mientras esto sucede.  Inmediatamente despus del nacimiento, se secar al beb y se lo mantendr caliente. Podr sostener y amamantar a su beb.  Durante este momento, es posible que se pince y corte el cordn umbilical. Esto ocurre por lo general despus de un perodo de 1 a 2 minutos despus del parto.  Se le extraer la placenta del tero.  Las incisiones se cerrarn con puntos (suturas). Es posible que se apliquen grapas, goma para cerrar la piel o tiras adhesivas en la incisin del abdomen.  Pueden colocarle vendas (vendajes) sobre la incisin del abdomen. El procedimiento puede variar segn el mdico y el hospital.   Qu ocurre despus del procedimiento?  Le controlarn la presin arterial, la frecuencia cardaca, la frecuencia respiratoria y el nivel de oxgeno en la sangre hasta que le den el alta del hospital.  Puede seguir recibiendo lquidos o medicamentos por va intravenosa.  Sentir un poco de dolor. Tendr analgsicos disponibles para ayudarla a controlar el dolor.  Para evitar la formacin de cogulos de sangre: ? Se le pueden administrar medicamentos. ? Quizs deba usar medias o dispositivos de compresin. ? Se le indicar que camine cuando pueda hacerlo.  El personal del hospital alentar y apoyar que cree lazos con su beb. En el hospital, es posible que le permitan que el beb permanezca en la misma habitacin que usted (internacin conjunta) durante la hospitalizacin para fomentar la creacin del  vnculo y un amamantamiento exitoso.  Se le puede sugerir que tosa y respire profundamente con frecuencia. Esto ayuda a evitar problemas pulmonares.  Si tiene un catter que le drena la orina, se le quitar lo antes posible despus del procedimiento. Resumen  El nacimiento o parto por cesrea es el parto quirrgico de un beb a travs de una incisin en el abdomen y el tero.  Siga las indicaciones del mdico con respecto a las restricciones de comidas o bebidas antes del procedimiento.  Sentir algo de dolor despus del procedimiento. Tendr analgsicos disponibles para ayudarla a controlar el dolor.  El personal del hospital alentar y apoyar que cree lazos con su beb despus del procedimiento. En el hospital, es posible que le permitan que el beb permanezca en la misma habitacin que usted (internacin conjunta) durante la hospitalizacin para fomentar la creacin del vnculo y un amamantamiento exitoso. Esta informacin no tiene como fin reemplazar el consejo del mdico. Asegrese de hacerle al mdico cualquier pregunta que tenga. Document Revised: 07/19/2020 Document Reviewed: 05/07/2018 Elsevier Patient Education  2021 Elsevier Inc.  

## 2020-12-29 NOTE — Procedures (Signed)
Victoria Richmond [birth year] [redacted]w[redacted]d  Fetus A Non-Stress Test Interpretation for 12/29/20  Indication: Unsatisfactory BPP  Fetal Heart Rate A Mode: External Baseline Rate (A): 140 bpm Variability: Moderate Accelerations: 15 x 15 Decelerations: None Multiple birth?: No  Uterine Activity Mode: Palpation,Toco Contraction Frequency (min): occ Contraction Duration (sec): 50-60 Contraction Quality: Mild Resting Tone Palpated: Relaxed Resting Time: Adequate  Interpretation (Fetal Testing) Nonstress Test Interpretation: Reactive Overall Impression: Reassuring for gestational age Comments: Dr. Parke Poisson reviewed tracing.

## 2020-12-29 NOTE — Progress Notes (Signed)
   PRENATAL VISIT NOTE  Subjective:  Victoria Richmond is a 35 y.o. G4P3003 at [redacted]w[redacted]d being seen today for ongoing prenatal care.  She is currently monitored for the following issues for this high-risk pregnancy and has Breast pain, left; Previous cesarean delivery affecting pregnancy, antepartum; Gestational diabetes mellitus, antepartum; Bladder laceration as postoperative complication; Vaginal bleeding in pregnancy, first trimester; History of gestational diabetes mellitus; Supervision of high risk pregnancy, antepartum; Fetal macrosomia affecting management of mother, antepartum; Polyhydramnios affecting pregnancy; and [redacted] weeks gestation of pregnancy on their problem list.  Patient doing well with no acute concerns today. She reports foot swelling.  Contractions: Irritability. Vag. Bleeding: None.  Movement: Present. Denies leaking of fluid.    FBS: 84-93 PPBS: 80-134  The following portions of the patient's history were reviewed and updated as appropriate: allergies, current medications, past family history, past medical history, past social history, past surgical history and problem list. Problem list updated.  Objective:   Vitals:   12/29/20 0901  BP: 106/71  Pulse: 86  Weight: 170 lb 11.2 oz (77.4 kg)    Fetal Status: Fetal Heart Rate (bpm): 140 Fundal Height: 42 cm Movement: Present     General:  Alert, oriented and cooperative. Patient is in no acute distress.  Skin: Skin is warm and dry. No rash noted.   Cardiovascular: Normal heart rate noted  Respiratory: Normal respiratory effort, no problems with respiration noted  Abdomen: Soft, gravid, appropriate for gestational age.  Pain/Pressure: Present     Pelvic: Cervical exam deferred        Extremities: Normal range of motion.  Edema: Trace  Mental Status:  Normal mood and affect. Normal behavior. Normal judgment and thought content.   Assessment and Plan:  Pregnancy: G4P3003 at [redacted]w[redacted]d  1. Gestational diabetes  mellitus (GDM) controlled on oral hypoglycemic drug, antepartum Good control with metformin  2. Bladder laceration as postoperative complication Pt will have vertical incision for cesarean section  3. Fetal macrosomia affecting management of mother, antepartum Repeat c/s on 4/4  4. Polyhydramnios affecting pregnancy Pt has fetal testing today  5. Previous cesarean delivery affecting pregnancy, antepartum Repeat c/s scheduled  6. Supervision of high risk pregnancy, antepartum   7. [redacted] weeks gestation of pregnancy   Term labor symptoms and general obstetric precautions including but not limited to vaginal bleeding, contractions, leaking of fluid and fetal movement were reviewed in detail with the patient.  Please refer to After Visit Summary for other counseling recommendations.   Return in about 1 week (around 01/05/2021) for [facility], in person.   Mariel Aloe, MD Faculty Attending Center for [facility]

## 2021-01-04 ENCOUNTER — Encounter: Payer: Self-pay | Admitting: Obstetrics and Gynecology

## 2021-01-04 ENCOUNTER — Ambulatory Visit: Payer: Self-pay | Attending: Obstetrics and Gynecology

## 2021-01-04 ENCOUNTER — Ambulatory Visit (INDEPENDENT_AMBULATORY_CARE_PROVIDER_SITE_OTHER): Payer: Self-pay | Admitting: Obstetrics and Gynecology

## 2021-01-04 ENCOUNTER — Encounter: Payer: Self-pay | Admitting: *Deleted

## 2021-01-04 ENCOUNTER — Ambulatory Visit: Payer: Self-pay | Admitting: *Deleted

## 2021-01-04 ENCOUNTER — Other Ambulatory Visit: Payer: Self-pay

## 2021-01-04 VITALS — BP 121/71 | HR 78 | Wt 175.0 lb

## 2021-01-04 DIAGNOSIS — N736 Female pelvic peritoneal adhesions (postinfective): Secondary | ICD-10-CM

## 2021-01-04 DIAGNOSIS — O0990 Supervision of high risk pregnancy, unspecified, unspecified trimester: Secondary | ICD-10-CM

## 2021-01-04 DIAGNOSIS — N9989 Other postprocedural complications and disorders of genitourinary system: Secondary | ICD-10-CM

## 2021-01-04 DIAGNOSIS — O403XX0 Polyhydramnios, third trimester, not applicable or unspecified: Secondary | ICD-10-CM

## 2021-01-04 DIAGNOSIS — O209 Hemorrhage in early pregnancy, unspecified: Secondary | ICD-10-CM | POA: Insufficient documentation

## 2021-01-04 DIAGNOSIS — O09529 Supervision of elderly multigravida, unspecified trimester: Secondary | ICD-10-CM | POA: Insufficient documentation

## 2021-01-04 DIAGNOSIS — O09523 Supervision of elderly multigravida, third trimester: Secondary | ICD-10-CM

## 2021-01-04 DIAGNOSIS — O24419 Gestational diabetes mellitus in pregnancy, unspecified control: Secondary | ICD-10-CM

## 2021-01-04 DIAGNOSIS — O24415 Gestational diabetes mellitus in pregnancy, controlled by oral hypoglycemic drugs: Secondary | ICD-10-CM

## 2021-01-04 DIAGNOSIS — O3663X0 Maternal care for excessive fetal growth, third trimester, not applicable or unspecified: Secondary | ICD-10-CM

## 2021-01-04 DIAGNOSIS — O34219 Maternal care for unspecified type scar from previous cesarean delivery: Secondary | ICD-10-CM

## 2021-01-04 DIAGNOSIS — Z789 Other specified health status: Secondary | ICD-10-CM

## 2021-01-04 DIAGNOSIS — O409XX0 Polyhydramnios, unspecified trimester, not applicable or unspecified: Secondary | ICD-10-CM

## 2021-01-04 DIAGNOSIS — Z758 Other problems related to medical facilities and other health care: Secondary | ICD-10-CM

## 2021-01-04 LAB — POCT URINALYSIS DIP (DEVICE)
Bilirubin Urine: NEGATIVE
Glucose, UA: NEGATIVE mg/dL
Hgb urine dipstick: NEGATIVE
Ketones, ur: NEGATIVE mg/dL
Leukocytes,Ua: NEGATIVE
Nitrite: NEGATIVE
Protein, ur: NEGATIVE mg/dL
Specific Gravity, Urine: 1.01 (ref 1.005–1.030)
Urobilinogen, UA: 0.2 mg/dL (ref 0.0–1.0)
pH: 6 (ref 5.0–8.0)

## 2021-01-04 NOTE — Progress Notes (Addendum)
   PRENATAL VISIT NOTE  Subjective:  Victoria Richmond is a 35 y.o. G4P3003 at [redacted]w[redacted]d being seen today for ongoing prenatal care.  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  Patient reports abdominal and pelvic pressure and discomfort.  Contractions: Irregular. Vag. Bleeding: None.  Movement: Present. Denies leaking of fluid.   The following portions of the patient's history were reviewed and updated as appropriate: allergies, current medications, past family history, past medical history, past social history, past surgical history and problem list.   Objective:   Vitals:   01/04/21 1309  BP: 121/71  Pulse: 78  Weight: 175 lb (79.4 kg)    Fetal Status: Fetal Heart Rate (bpm): 138   Movement: Present     General:  Alert, oriented and cooperative. Patient is in no acute distress.  Skin: Skin is warm and dry. No rash noted.   Cardiovascular: Normal heart rate noted  Respiratory: Normal respiratory effort, no problems with respiration noted  Abdomen: Soft, gravid, appropriate for gestational age.  Pain/Pressure: Present     Pelvic: Cervical exam deferred        Extremities: Normal range of motion.  Edema: Trace  Mental Status: Normal mood and affect. Normal behavior. Normal judgment and thought content.   Assessment and Plan:  Pregnancy: G4P3003 at [redacted]w[redacted]d 1. Gestational diabetes mellitus (GDM), antepartum, gestational diabetes method of control unspecified Patient confirms on  metformin 500 bidac. Normal CBG paper log today. bpp 8/8 with stable poly of 33 today with MFM. Pt for rpt c/s and BTL on Monday.   2. Pelvic adhesive disease Recommend CSE for c/s and to give Urology notice pre op in case they are needed  3. Bladder laceration as postoperative complication  4. Supervision of high risk pregnancy, antepartum Routine care  5. Excessive fetal growth affecting management of pregnancy in third trimester, single or unspecified fetus  6. Polyhydramnios affecting pregnancy  7. Previous cesarean delivery affecting pregnancy, antepartum  8. Multigravida of advanced maternal age in third trimester  Interpreter used  Term labor symptoms and general obstetric precautions including but not limited to vaginal bleeding, contractions, leaking of fluid and fetal movement were reviewed in detail with the patient. Please refer to After Visit Summary for other counseling recommendations.   Return in about 11 days (around 01/15/2021) for RN incision check.  Future Appointments  Date Time Provider Department Center  01/05/2021  9:15 AM [facility]-SCREENING [facility] None  01/05/2021 10:00 AM [facility]-LD PAT 1 [facility] None    [place]un[place] Bing, MD

## 2021-01-05 ENCOUNTER — Other Ambulatory Visit (HOSPITAL_COMMUNITY)
Admission: RE | Admit: 2021-01-05 | Discharge: 2021-01-05 | Disposition: A | Discharge status: Home / Self Care | Payer: Self-pay | Source: Ambulatory Visit | Attending: Obstetrics & Gynecology | Admitting: Obstetrics & Gynecology

## 2021-01-05 ENCOUNTER — Other Ambulatory Visit: Payer: Self-pay

## 2021-01-05 ENCOUNTER — Encounter (HOSPITAL_COMMUNITY)
Admission: RE | Admit: 2021-01-05 | Discharge: 2021-01-05 | Disposition: A | Discharge status: Home / Self Care | Payer: Self-pay | Source: Ambulatory Visit | Attending: Obstetrics & Gynecology | Admitting: Obstetrics & Gynecology

## 2021-01-05 DIAGNOSIS — Z20822 Contact with and (suspected) exposure to covid-19: Secondary | ICD-10-CM | POA: Insufficient documentation

## 2021-01-05 DIAGNOSIS — Z01812 Encounter for preprocedural laboratory examination: Secondary | ICD-10-CM | POA: Insufficient documentation

## 2021-01-05 HISTORY — DX: Other complications of anesthesia, initial encounter: T88.59XA

## 2021-01-05 LAB — COMPREHENSIVE METABOLIC PANEL WITH GFR
ALT: 43 U/L (ref 0–44)
AST: 44 U/L — ABNORMAL HIGH (ref 15–41)
Albumin: 2.3 g/dL — ABNORMAL LOW (ref 3.5–5.0)
Alkaline Phosphatase: 180 U/L — ABNORMAL HIGH (ref 38–126)
Anion gap: 7 (ref 5–15)
BUN: 7 mg/dL (ref 6–20)
CO2: 22 mmol/L (ref 22–32)
Calcium: 9.1 mg/dL (ref 8.9–10.3)
Chloride: 107 mmol/L (ref 98–111)
Creatinine, Ser: 0.74 mg/dL (ref 0.44–1.00)
GFR, Estimated: >60 mL/min
Glucose, Bld: 149 mg/dL — ABNORMAL HIGH (ref 70–99)
Potassium: 4.3 mmol/L (ref 3.5–5.1)
Sodium: 136 mmol/L (ref 135–145)
Total Bilirubin: 0.7 mg/dL (ref 0.3–1.2)
Total Protein: 5.8 g/dL — ABNORMAL LOW (ref 6.5–8.1)

## 2021-01-05 LAB — CBC
HCT: 37.2 % (ref 36.0–46.0)
Hemoglobin: 12.2 g/dL (ref 12.0–15.0)
MCH: 29.8 pg (ref 26.0–34.0)
MCHC: 32.8 g/dL (ref 30.0–36.0)
MCV: 90.7 fL (ref 80.0–100.0)
Platelets: 123 K/uL — ABNORMAL LOW (ref 150–400)
RBC: 4.1 MIL/uL (ref 3.87–5.11)
RDW: 15 % (ref 11.5–15.5)
WBC: 4.7 K/uL (ref 4.0–10.5)
nRBC: 0 % (ref 0.0–0.2)

## 2021-01-06 LAB — RPR: RPR Ser Ql: NONREACTIVE

## 2021-01-06 LAB — SARS CORONAVIRUS 2 (TAT 6-24 HRS): SARS Coronavirus 2: NEGATIVE

## 2021-01-08 ENCOUNTER — Encounter (HOSPITAL_COMMUNITY)
Admission: AD | Disposition: A | Discharge status: Home / Self Care | Payer: Self-pay | Source: Home / Self Care | Attending: Obstetrics & Gynecology

## 2021-01-08 ENCOUNTER — Inpatient Hospital Stay (HOSPITAL_COMMUNITY): Payer: Medicaid Other | Admitting: Anesthesiology

## 2021-01-08 ENCOUNTER — Encounter (HOSPITAL_COMMUNITY): Payer: Self-pay | Admitting: Obstetrics and Gynecology

## 2021-01-08 ENCOUNTER — Inpatient Hospital Stay (HOSPITAL_COMMUNITY): Admission: RE | Admit: 2021-01-08 | Payer: Self-pay | Source: Home / Self Care | Admitting: Obstetrics & Gynecology

## 2021-01-08 ENCOUNTER — Inpatient Hospital Stay (HOSPITAL_COMMUNITY)
Admission: AD | Admit: 2021-01-08 | Discharge: 2021-01-11 | DRG: 784 | Disposition: A | Discharge status: Home / Self Care | Payer: Medicaid Other | Attending: Obstetrics & Gynecology | Admitting: Obstetrics & Gynecology

## 2021-01-08 DIAGNOSIS — O3663X0 Maternal care for excessive fetal growth, third trimester, not applicable or unspecified: Secondary | ICD-10-CM | POA: Diagnosis present

## 2021-01-08 DIAGNOSIS — O403XX0 Polyhydramnios, third trimester, not applicable or unspecified: Secondary | ICD-10-CM | POA: Diagnosis present

## 2021-01-08 DIAGNOSIS — Z3A39 39 weeks gestation of pregnancy: Secondary | ICD-10-CM

## 2021-01-08 DIAGNOSIS — O9913 Other diseases of the blood and blood-forming organs and certain disorders involving the immune mechanism complicating the puerperium: Secondary | ICD-10-CM | POA: Diagnosis not present

## 2021-01-08 DIAGNOSIS — Z9079 Acquired absence of other genital organ(s): Secondary | ICD-10-CM

## 2021-01-08 DIAGNOSIS — O9902 Anemia complicating childbirth: Secondary | ICD-10-CM | POA: Diagnosis present

## 2021-01-08 DIAGNOSIS — D696 Thrombocytopenia, unspecified: Secondary | ICD-10-CM | POA: Diagnosis present

## 2021-01-08 DIAGNOSIS — D649 Anemia, unspecified: Secondary | ICD-10-CM | POA: Diagnosis not present

## 2021-01-08 DIAGNOSIS — O4202 Full-term premature rupture of membranes, onset of labor within 24 hours of rupture: Secondary | ICD-10-CM | POA: Diagnosis not present

## 2021-01-08 DIAGNOSIS — O09529 Supervision of elderly multigravida, unspecified trimester: Secondary | ICD-10-CM

## 2021-01-08 DIAGNOSIS — O3660X0 Maternal care for excessive fetal growth, unspecified trimester, not applicable or unspecified: Secondary | ICD-10-CM | POA: Diagnosis present

## 2021-01-08 DIAGNOSIS — O139 Gestational [pregnancy-induced] hypertension without significant proteinuria, unspecified trimester: Secondary | ICD-10-CM

## 2021-01-08 DIAGNOSIS — O34212 Maternal care for vertical scar from previous cesarean delivery: Principal | ICD-10-CM | POA: Diagnosis present

## 2021-01-08 DIAGNOSIS — O26893 Other specified pregnancy related conditions, third trimester: Secondary | ICD-10-CM | POA: Diagnosis present

## 2021-01-08 DIAGNOSIS — O34211 Maternal care for low transverse scar from previous cesarean delivery: Secondary | ICD-10-CM

## 2021-01-08 DIAGNOSIS — N736 Female pelvic peritoneal adhesions (postinfective): Secondary | ICD-10-CM | POA: Diagnosis present

## 2021-01-08 DIAGNOSIS — Z302 Encounter for sterilization: Secondary | ICD-10-CM | POA: Diagnosis not present

## 2021-01-08 DIAGNOSIS — N9989 Other postprocedural complications and disorders of genitourinary system: Secondary | ICD-10-CM | POA: Diagnosis present

## 2021-01-08 DIAGNOSIS — O9903 Anemia complicating the puerperium: Secondary | ICD-10-CM | POA: Diagnosis not present

## 2021-01-08 DIAGNOSIS — O135 Gestational [pregnancy-induced] hypertension without significant proteinuria, complicating the puerperium: Secondary | ICD-10-CM | POA: Diagnosis not present

## 2021-01-08 DIAGNOSIS — O134 Gestational [pregnancy-induced] hypertension without significant proteinuria, complicating childbirth: Secondary | ICD-10-CM | POA: Diagnosis present

## 2021-01-08 DIAGNOSIS — Z789 Other specified health status: Secondary | ICD-10-CM | POA: Diagnosis present

## 2021-01-08 DIAGNOSIS — O9912 Other diseases of the blood and blood-forming organs and certain disorders involving the immune mechanism complicating childbirth: Secondary | ICD-10-CM | POA: Diagnosis present

## 2021-01-08 DIAGNOSIS — O409XX0 Polyhydramnios, unspecified trimester, not applicable or unspecified: Secondary | ICD-10-CM | POA: Diagnosis present

## 2021-01-08 DIAGNOSIS — O34219 Maternal care for unspecified type scar from previous cesarean delivery: Secondary | ICD-10-CM | POA: Diagnosis present

## 2021-01-08 DIAGNOSIS — O24419 Gestational diabetes mellitus in pregnancy, unspecified control: Secondary | ICD-10-CM | POA: Diagnosis present

## 2021-01-08 DIAGNOSIS — O0990 Supervision of high risk pregnancy, unspecified, unspecified trimester: Secondary | ICD-10-CM

## 2021-01-08 DIAGNOSIS — Z98891 History of uterine scar from previous surgery: Secondary | ICD-10-CM

## 2021-01-08 DIAGNOSIS — O24424 Gestational diabetes mellitus in childbirth, insulin controlled: Secondary | ICD-10-CM | POA: Diagnosis not present

## 2021-01-08 LAB — CBC
HCT: 38.4 % (ref 36.0–46.0)
Hemoglobin: 12.8 g/dL (ref 12.0–15.0)
MCH: 30.3 pg (ref 26.0–34.0)
MCHC: 33.3 g/dL (ref 30.0–36.0)
MCV: 91 fL (ref 80.0–100.0)
Platelets: 126 10*3/uL — ABNORMAL LOW (ref 150–400)
RBC: 4.22 MIL/uL (ref 3.87–5.11)
RDW: 14.9 % (ref 11.5–15.5)
WBC: 7.3 10*3/uL (ref 4.0–10.5)
nRBC: 0 % (ref 0.0–0.2)

## 2021-01-08 LAB — TYPE AND SCREEN
ABO/RH(D): O POS
Antibody Screen: POSITIVE
Donor AG Type: NEGATIVE
Donor AG Type: NEGATIVE
Unit division: 0
Unit division: 0

## 2021-01-08 LAB — BPAM RBC
Blood Product Expiration Date: 202205072359
Blood Product Expiration Date: 202205072359
Unit Type and Rh: 5100
Unit Type and Rh: 5100

## 2021-01-08 LAB — POCT FERN TEST: POCT Fern Test: POSITIVE

## 2021-01-08 LAB — RPR: RPR Ser Ql: NONREACTIVE

## 2021-01-08 LAB — GLUCOSE, CAPILLARY
Glucose-Capillary: 81 mg/dL (ref 70–99)
Glucose-Capillary: 108 mg/dL — ABNORMAL HIGH (ref 70–99)

## 2021-01-08 SURGERY — Surgical Case
Anesthesia: Regional

## 2021-01-08 MED ORDER — DIPHENHYDRAMINE HCL 25 MG PO CAPS: 25.0000 mg | ORAL_CAPSULE | Freq: Four times a day (QID) | ORAL | Status: DC | PRN

## 2021-01-08 MED ORDER — FENTANYL CITRATE (PF) 100 MCG/2ML IJ SOLN
50.0000 ug | Freq: Once | INTRAMUSCULAR | Status: AC
  Administered 2021-01-08: 50 ug via INTRAVENOUS
  Filled 2021-01-08: qty 2

## 2021-01-08 MED ORDER — MEPERIDINE HCL 25 MG/ML IJ SOLN: 6.2500 mg | INTRAMUSCULAR | Status: DC | PRN

## 2021-01-08 MED ORDER — OXYTOCIN-SODIUM CHLORIDE 30-0.9 UT/500ML-% IV SOLN
INTRAVENOUS | Status: AC
  Filled 2021-01-08: qty 500

## 2021-01-08 MED ORDER — OXYTOCIN-SODIUM CHLORIDE 30-0.9 UT/500ML-% IV SOLN: 2.5000 [IU]/h | INTRAVENOUS | Status: AC

## 2021-01-08 MED ORDER — KETOROLAC TROMETHAMINE 30 MG/ML IJ SOLN
30.0000 mg | Freq: Four times a day (QID) | INTRAMUSCULAR | Status: AC
  Administered 2021-01-08 – 2021-01-09 (×4): 30 mg via INTRAVENOUS
  Filled 2021-01-08 (×4): qty 1

## 2021-01-08 MED ORDER — DEXAMETHASONE SODIUM PHOSPHATE 10 MG/ML IJ SOLN
INTRAMUSCULAR | Status: AC
  Filled 2021-01-08: qty 1

## 2021-01-08 MED ORDER — FENTANYL CITRATE (PF) 100 MCG/2ML IJ SOLN
INTRAMUSCULAR | Status: DC | PRN
  Administered 2021-01-08: 100 ug via INTRAVENOUS

## 2021-01-08 MED ORDER — FENTANYL CITRATE (PF) 100 MCG/2ML IJ SOLN: 25.0000 ug | INTRAMUSCULAR | Status: DC | PRN

## 2021-01-08 MED ORDER — DIPHENHYDRAMINE HCL 50 MG/ML IJ SOLN: 12.5000 mg | INTRAMUSCULAR | Status: DC | PRN

## 2021-01-08 MED ORDER — MORPHINE SULFATE (PF) 0.5 MG/ML IJ SOLN
INTRAMUSCULAR | Status: AC
  Filled 2021-01-08: qty 10

## 2021-01-08 MED ORDER — MORPHINE SULFATE (PF) 0.5 MG/ML IJ SOLN
INTRAMUSCULAR | Status: DC | PRN
  Administered 2021-01-08: 150 ug via INTRATHECAL

## 2021-01-08 MED ORDER — KETOROLAC TROMETHAMINE 30 MG/ML IJ SOLN
INTRAMUSCULAR | Status: AC
  Filled 2021-01-08: qty 1

## 2021-01-08 MED ORDER — FAMOTIDINE IN NACL 20-0.9 MG/50ML-% IV SOLN
20.0000 mg | Freq: Once | INTRAVENOUS | Status: AC
  Administered 2021-01-08: 20 mg via INTRAVENOUS
  Filled 2021-01-08: qty 50

## 2021-01-08 MED ORDER — NALOXONE HCL 0.4 MG/ML IJ SOLN: 0.4000 mg | INTRAMUSCULAR | Status: DC | PRN

## 2021-01-08 MED ORDER — ONDANSETRON HCL 4 MG/2ML IJ SOLN
INTRAMUSCULAR | Status: DC | PRN
  Administered 2021-01-08: 4 mg via INTRAVENOUS

## 2021-01-08 MED ORDER — NALOXONE HCL 4 MG/10ML IJ SOLN
1.0000 ug/kg/h | INTRAVENOUS | Status: DC | PRN
  Filled 2021-01-08: qty 5

## 2021-01-08 MED ORDER — PHENYLEPHRINE HCL (PRESSORS) 10 MG/ML IV SOLN
INTRAVENOUS | Status: DC | PRN
  Administered 2021-01-08: 40 ug via INTRAVENOUS

## 2021-01-08 MED ORDER — ONDANSETRON HCL 4 MG/2ML IJ SOLN: 4.0000 mg | Freq: Once | INTRAMUSCULAR | Status: DC | PRN

## 2021-01-08 MED ORDER — SOD CITRATE-CITRIC ACID 500-334 MG/5ML PO SOLN
30.0000 mL | Freq: Once | ORAL | Status: AC
  Administered 2021-01-08: 30 mL via ORAL
  Filled 2021-01-08: qty 15

## 2021-01-08 MED ORDER — LACTATED RINGERS IV SOLN: INTRAVENOUS | Status: DC

## 2021-01-08 MED ORDER — POVIDONE-IODINE 10 % EX SWAB: 2.0000 "application " | Freq: Once | CUTANEOUS | Status: DC

## 2021-01-08 MED ORDER — SODIUM CHLORIDE 0.9 % IV SOLN: INTRAVENOUS | Status: DC | PRN

## 2021-01-08 MED ORDER — DIBUCAINE (PERIANAL) 1 % EX OINT: 1.0000 "application " | TOPICAL_OINTMENT | CUTANEOUS | Status: DC | PRN

## 2021-01-08 MED ORDER — ENOXAPARIN SODIUM 40 MG/0.4ML ~~LOC~~ SOLN
40.0000 mg | SUBCUTANEOUS | Status: DC
  Administered 2021-01-09 – 2021-01-11 (×3): 40 mg via SUBCUTANEOUS
  Filled 2021-01-08 (×3): qty 0.4

## 2021-01-08 MED ORDER — MENTHOL 3 MG MT LOZG: 1.0000 | LOZENGE | OROMUCOSAL | Status: DC | PRN

## 2021-01-08 MED ORDER — PHENYLEPHRINE HCL-NACL 20-0.9 MG/250ML-% IV SOLN
INTRAVENOUS | Status: AC
  Filled 2021-01-08: qty 250

## 2021-01-08 MED ORDER — SOD CITRATE-CITRIC ACID 500-334 MG/5ML PO SOLN: 30.0000 mL | ORAL | Status: DC

## 2021-01-08 MED ORDER — KETOROLAC TROMETHAMINE 30 MG/ML IJ SOLN
30.0000 mg | Freq: Once | INTRAMUSCULAR | Status: AC | PRN
  Administered 2021-01-08: 30 mg via INTRAVENOUS

## 2021-01-08 MED ORDER — ONDANSETRON HCL 4 MG/2ML IJ SOLN: 4.0000 mg | Freq: Three times a day (TID) | INTRAMUSCULAR | Status: DC | PRN

## 2021-01-08 MED ORDER — FENTANYL CITRATE (PF) 100 MCG/2ML IJ SOLN
INTRAMUSCULAR | Status: AC
  Filled 2021-01-08: qty 2

## 2021-01-08 MED ORDER — SCOPOLAMINE 1 MG/3DAYS TD PT72
MEDICATED_PATCH | TRANSDERMAL | Status: AC
  Filled 2021-01-08: qty 1

## 2021-01-08 MED ORDER — TERBUTALINE SULFATE 1 MG/ML IJ SOLN
0.2500 mg | Freq: Once | INTRAMUSCULAR | Status: AC
  Administered 2021-01-08: 0.25 mg via SUBCUTANEOUS
  Filled 2021-01-08: qty 1

## 2021-01-08 MED ORDER — DEXAMETHASONE SODIUM PHOSPHATE 10 MG/ML IJ SOLN
INTRAMUSCULAR | Status: DC | PRN
  Administered 2021-01-08: 10 mg via INTRAVENOUS
  Administered 2021-01-08: 5 mg via INTRAVENOUS

## 2021-01-08 MED ORDER — OXYCODONE HCL 5 MG PO TABS
5.0000 mg | ORAL_TABLET | ORAL | Status: DC | PRN
  Administered 2021-01-10: 10 mg via ORAL
  Filled 2021-01-08: qty 2

## 2021-01-08 MED ORDER — SODIUM CHLORIDE 0.9% FLUSH: 3.0000 mL | INTRAVENOUS | Status: DC | PRN

## 2021-01-08 MED ORDER — TETANUS-DIPHTH-ACELL PERTUSSIS 5-2.5-18.5 LF-MCG/0.5 IM SUSY: 0.5000 mL | PREFILLED_SYRINGE | Freq: Once | INTRAMUSCULAR | Status: DC

## 2021-01-08 MED ORDER — CEFAZOLIN SODIUM-DEXTROSE 2-4 GM/100ML-% IV SOLN
2.0000 g | INTRAVENOUS | Status: AC
  Administered 2021-01-08: 2 g via INTRAVENOUS
  Filled 2021-01-08: qty 100

## 2021-01-08 MED ORDER — FENTANYL CITRATE (PF) 100 MCG/2ML IJ SOLN
INTRAMUSCULAR | Status: DC | PRN
  Administered 2021-01-08: 15 ug via INTRATHECAL

## 2021-01-08 MED ORDER — ACETAMINOPHEN 325 MG PO TABS
650.0000 mg | ORAL_TABLET | ORAL | Status: DC | PRN
  Administered 2021-01-09 – 2021-01-11 (×7): 650 mg via ORAL
  Filled 2021-01-08 (×7): qty 2

## 2021-01-08 MED ORDER — SIMETHICONE 80 MG PO CHEW
80.0000 mg | CHEWABLE_TABLET | Freq: Three times a day (TID) | ORAL | Status: DC
  Administered 2021-01-08 – 2021-01-11 (×8): 80 mg via ORAL
  Filled 2021-01-08 (×7): qty 1

## 2021-01-08 MED ORDER — COCONUT OIL OIL: 1.0000 "application " | TOPICAL_OIL | Status: DC | PRN

## 2021-01-08 MED ORDER — PHENYLEPHRINE HCL-NACL 20-0.9 MG/250ML-% IV SOLN
INTRAVENOUS | Status: DC | PRN
  Administered 2021-01-08: 60 ug/min via INTRAVENOUS

## 2021-01-08 MED ORDER — NALBUPHINE HCL 10 MG/ML IJ SOLN: 5.0000 mg | INTRAMUSCULAR | Status: DC | PRN

## 2021-01-08 MED ORDER — IBUPROFEN 800 MG PO TABS
800.0000 mg | ORAL_TABLET | Freq: Four times a day (QID) | ORAL | Status: DC
  Administered 2021-01-09 – 2021-01-11 (×7): 800 mg via ORAL
  Filled 2021-01-08 (×8): qty 1

## 2021-01-08 MED ORDER — LIDOCAINE-EPINEPHRINE 2 %-1:100000 IJ SOLN
INTRAMUSCULAR | Status: DC | PRN
  Administered 2021-01-08 (×2): 5 mL via INTRADERMAL

## 2021-01-08 MED ORDER — WITCH HAZEL-GLYCERIN EX PADS: 1.0000 "application " | MEDICATED_PAD | CUTANEOUS | Status: DC | PRN

## 2021-01-08 MED ORDER — MEASLES, MUMPS & RUBELLA VAC IJ SOLR: 0.5000 mL | Freq: Once | INTRAMUSCULAR | Status: DC

## 2021-01-08 MED ORDER — LACTATED RINGERS IV SOLN: INTRAVENOUS | Status: DC | PRN

## 2021-01-08 MED ORDER — SIMETHICONE 80 MG PO CHEW: 80.0000 mg | CHEWABLE_TABLET | ORAL | Status: DC | PRN

## 2021-01-08 MED ORDER — SENNOSIDES-DOCUSATE SODIUM 8.6-50 MG PO TABS
2.0000 | ORAL_TABLET | ORAL | Status: DC
  Administered 2021-01-09 – 2021-01-10 (×2): 2 via ORAL
  Filled 2021-01-08: qty 2

## 2021-01-08 MED ORDER — NALBUPHINE HCL 10 MG/ML IJ SOLN: 5.0000 mg | Freq: Once | INTRAMUSCULAR | Status: DC | PRN

## 2021-01-08 MED ORDER — LACTATED RINGERS IV BOLUS
1000.0000 mL | Freq: Once | INTRAVENOUS | Status: AC
  Administered 2021-01-08: 1000 mL via INTRAVENOUS

## 2021-01-08 MED ORDER — SCOPOLAMINE 1 MG/3DAYS TD PT72
1.0000 | MEDICATED_PATCH | Freq: Once | TRANSDERMAL | Status: AC
  Administered 2021-01-08: 1.5 mg via TRANSDERMAL

## 2021-01-08 MED ORDER — ONDANSETRON HCL 4 MG/2ML IJ SOLN
INTRAMUSCULAR | Status: AC
  Filled 2021-01-08: qty 2

## 2021-01-08 MED ORDER — KETOROLAC TROMETHAMINE 30 MG/ML IJ SOLN: 30.0000 mg | Freq: Once | INTRAMUSCULAR | Status: DC

## 2021-01-08 MED ORDER — DIPHENHYDRAMINE HCL 25 MG PO CAPS: 25.0000 mg | ORAL_CAPSULE | ORAL | Status: DC | PRN

## 2021-01-08 MED ORDER — PRENATAL MULTIVITAMIN CH
1.0000 | ORAL_TABLET | Freq: Every day | ORAL | Status: DC
  Administered 2021-01-09 – 2021-01-11 (×2): 1 via ORAL
  Filled 2021-01-08 (×3): qty 1

## 2021-01-08 MED ORDER — BUPIVACAINE IN DEXTROSE 0.75-8.25 % IT SOLN
INTRATHECAL | Status: DC | PRN
  Administered 2021-01-08: 1.6 mL via INTRATHECAL

## 2021-01-08 MED ORDER — TRANEXAMIC ACID-NACL 1000-0.7 MG/100ML-% IV SOLN
INTRAVENOUS | Status: DC | PRN
  Administered 2021-01-08: 1000 mg via INTRAVENOUS

## 2021-01-08 MED ORDER — PHENYLEPHRINE 40 MCG/ML (10ML) SYRINGE FOR IV PUSH (FOR BLOOD PRESSURE SUPPORT)
PREFILLED_SYRINGE | INTRAVENOUS | Status: AC
  Filled 2021-01-08: qty 10

## 2021-01-08 SURGICAL SUPPLY — 41 items
ADH SKN CLS APL DERMABOND .7 (GAUZE/BANDAGES/DRESSINGS)
APL SKNCLS STERI-STRIP NONHPOA (GAUZE/BANDAGES/DRESSINGS) ×1
BENZOIN TINCTURE PRP APPL 2/3 (GAUZE/BANDAGES/DRESSINGS) ×2 IMPLANT
CLAMP CORD UMBIL (MISCELLANEOUS) IMPLANT
CLOTH BEACON ORANGE TIMEOUT ST (SAFETY) ×2 IMPLANT
DERMABOND ADVANCED (GAUZE/BANDAGES/DRESSINGS)
DERMABOND ADVANCED .7 DNX12 (GAUZE/BANDAGES/DRESSINGS) IMPLANT
DRSG OPSITE POSTOP 4X10 (GAUZE/BANDAGES/DRESSINGS) ×2 IMPLANT
ELECT REM PT RETURN 9FT ADLT (ELECTROSURGICAL) ×2
ELECTRODE REM PT RTRN 9FT ADLT (ELECTROSURGICAL) ×1 IMPLANT
EXTRACTOR VACUUM KIWI (MISCELLANEOUS) IMPLANT
GLOVE BIOGEL PI IND STRL 7.0 (GLOVE) ×3 IMPLANT
GLOVE BIOGEL PI INDICATOR 7.0 (GLOVE) ×3
GLOVE ECLIPSE 6.5 STRL STRAW (GLOVE) ×2 IMPLANT
GOWN STRL REUS W/TWL LRG LVL3 (GOWN DISPOSABLE) ×4 IMPLANT
HEMOSTAT SURGICEL 4X8 (HEMOSTASIS) ×1 IMPLANT
KIT ABG SYR 3ML LUER SLIP (SYRINGE) IMPLANT
NDL HYPO 25X5/8 SAFETYGLIDE (NEEDLE) IMPLANT
NEEDLE HYPO 25X5/8 SAFETYGLIDE (NEEDLE) IMPLANT
NS IRRIG 1000ML POUR BTL (IV SOLUTION) ×2 IMPLANT
PACK C SECTION WH (CUSTOM PROCEDURE TRAY) ×2 IMPLANT
PAD ABD 7.5X8 STRL (GAUZE/BANDAGES/DRESSINGS) ×2 IMPLANT
PAD OB MATERNITY 4.3X12.25 (PERSONAL CARE ITEMS) ×2 IMPLANT
PENCIL SMOKE EVAC W/HOLSTER (ELECTROSURGICAL) ×2 IMPLANT
RTRCTR C-SECT PINK 25CM LRG (MISCELLANEOUS) ×2 IMPLANT
STAPLE OSTEOSYNTHE COMPRESSION (GAUZE/BANDAGES/DRESSINGS) ×1 IMPLANT
STRIP CLOSURE SKIN 1/2X4 (GAUZE/BANDAGES/DRESSINGS) ×2 IMPLANT
SUT MNCRL AB 3-0 PS2 27 (SUTURE) ×1 IMPLANT
SUT PDS AB 0 CTX 60 (SUTURE) ×2 IMPLANT
SUT PLAIN 0 NONE (SUTURE) ×2 IMPLANT
SUT PLAIN 2 0 XLH (SUTURE) ×2 IMPLANT
SUT VIC AB 0 CT1 27 (SUTURE) ×4
SUT VIC AB 0 CT1 27XBRD ANBCTR (SUTURE) ×2 IMPLANT
SUT VIC AB 0 CTX 36 (SUTURE) ×12
SUT VIC AB 0 CTX36XBRD ANBCTRL (SUTURE) ×3 IMPLANT
SUT VIC AB 2-0 CT1 27 (SUTURE) ×2
SUT VIC AB 2-0 CT1 TAPERPNT 27 (SUTURE) ×1 IMPLANT
SUT VIC AB 4-0 KS 27 (SUTURE) ×2 IMPLANT
TOWEL OR 17X24 6PK STRL BLUE (TOWEL DISPOSABLE) ×2 IMPLANT
TRAY FOLEY W/BAG SLVR 14FR LF (SET/KITS/TRAYS/PACK) IMPLANT
WATER STERILE IRR 1000ML POUR (IV SOLUTION) ×2 IMPLANT

## 2021-01-08 NOTE — Anesthesia Postprocedure Evaluation (Signed)
Anesthesia Post Note  Patient: Victoria Richmond  Procedure(s) Performed: CESAREAN SECTION WITH BILATERAL TUBAL LIGATION (N/A ) APPLICATION OF CELL SAVER (N/A )     Patient location during evaluation: Mother Baby Anesthesia Type: Combined Spinal/Epidural Level of consciousness: awake and alert Pain management: pain level controlled Vital Signs Assessment: post-procedure vital signs reviewed and stable Respiratory status: spontaneous breathing, nonlabored ventilation and respiratory function stable Cardiovascular status: stable Postop Assessment: no headache and no backache Anesthetic complications: no   No complications documented.  Last Vitals:  Vitals:   01/08/21 1345 01/08/21 1350  BP: 124/78 (!) 141/93  Pulse: 61 (!) 51  Resp: (!) 27 16  Temp:  36.6 C  SpO2: 100% 100%    Last Pain:  Vitals:   01/08/21 1350  TempSrc: Oral  PainSc:                  Nelle Don Gearl Baratta

## 2021-01-08 NOTE — Anesthesia Procedure Notes (Signed)
Spinal  Patient location during procedure: OR Start time: 01/08/2021 9:54 AM End time: 01/08/2021 9:55 AM Staffing Performed: anesthesiologist  Anesthesiologist: Atilano Median, DO Preanesthetic Checklist Completed: patient identified, IV checked, site marked, risks and benefits discussed, surgical consent, monitors and equipment checked, pre-op evaluation and timeout performed Spinal Block Patient position: sitting Prep: DuraPrep Patient monitoring: heart rate, cardiac monitor, continuous pulse ox and blood pressure Approach: midline Location: L3-4 Injection technique: single-shot Needle Needle type: Pencan  Needle gauge: 24 G Needle length: 10 cm Assessment Events: CSF return Additional Notes CSE performed  Patient identified. Risks/Benefits/Options discussed with patient including but not limited to bleeding, infection, nerve damage, paralysis, failed block, incomplete pain control, headache, blood pressure changes, nausea, vomiting, reactions to medications, itching and postpartum back pain. Confirmed with bedside nurse the patient's most recent platelet count. Confirmed with patient that they are not currently taking any anticoagulation, have any bleeding history or any family history of bleeding disorders. Patient expressed understanding and wished to proceed. All questions were answered. Sterile technique was used throughout the entire procedure. Please see nursing notes for vital signs. Warning signs of high block given to the patient including shortness of breath, tingling/numbness in hands, complete motor block, or any concerning symptoms with instructions to call for help. Patient was given instructions on fall risk and not to get out of bed. All questions and concerns addressed with instructions to call with any issues or inadequate analgesia.

## 2021-01-08 NOTE — Progress Notes (Signed)
Per Dr. Alysia Penna case can be moved to 0900AM

## 2021-01-08 NOTE — Discharge Summary (Signed)
Postpartum Discharge Summary    Patient Name: Victoria Richmond DOB: 25-Oct-1985 MRN: [id number]  Date of admission: 01/08/2021 Delivery date:01/08/2021  Delivering provider: Janyth Pupa  Date of discharge: 01/11/2021  Admitting diagnosis: History of cesarean section [Z98.891] Intrauterine pregnancy: [redacted]w[redacted]d    Secondary diagnosis:  Active Problems:   Previous cesarean delivery affecting pregnancy, antepartum   Gestational diabetes mellitus, antepartum   Bladder laceration as postoperative complication   Supervision of high risk pregnancy, antepartum   LGA (large for gestational age) fetus affecting management of mother   Polyhydramnios affecting pregnancy   AMA (advanced maternal age) multigravida 35+   Pelvic adhesive disease   Language barrier   History of cesarean section   Delivery by classical cesarean section   History of bilateral salpingectomy   Gestational hypertension  Additional problems: none    Discharge diagnosis: Term Pregnancy Delivered and GDM A2                                              Post partum procedures:postpartum tubal ligation Augmentation: N/A Complications: None  Hospital course: Sceduled C/S   35y.o. yo [id number] admitted to the hospital 01/08/2021 for scheduled cesarean section with the following indication:Elective Repeat.Delivery details are as follows:  Membrane Rupture Time/Date: 1:00 AM ,01/08/2021   Delivery Method:C-Section, High Vertical  Details of operation can be found in separate operative note.  Patient had an uncomplicated postpartum course.  She is ambulating, tolerating a regular diet, passing flatus, and urinating well. Patient is discharged home in stable condition on  01/11/21. Given persistently elevated blood pressures s/p delivery, pt was discharged on norvasc [id number] with plan for 1 week BP check in clinic.        Newborn Data: Birth date:01/08/2021  Birth time:10:38 AM  Gender:Female  Living status:Living   Apgars:3 ,7  Weight:4840 g     Magnesium Sulfate received: No BMZ received: No Rhophylac:N/A MMR:N/A T-DaP:Given prenatally Flu: Yes Transfusion:No  Physical exam  Vitals:   01/10/21 0520 01/10/21 0718 01/10/21 2013 01/11/21 0633  BP: 136/86 140/72 121/75 135/79  Pulse: 63 60 71 63  Resp: 18  18 16   Temp: 97.8 F (36.6 C)  (!) 97.5 F (36.4 C) 98.3 F (36.8 C)  TempSrc: Oral  Oral Oral  SpO2:   99% 99%   General: alert, cooperative and no distress Lochia: appropriate Uterine Fundus: firm Incision: honeycomb dressing clean, dry, intact. DVT Evaluation: No evidence of DVT seen on physical exam. No cords or calf tenderness. 1+ LE edema bilaterally, improved from prior. Labs: Lab Results  Component Value Date   WBC 9.4 01/10/2021   HGB 9.2 (L) 01/10/2021   HCT 27.2 (L) 01/10/2021   MCV 90.7 01/10/2021   PLT 105 (L) 01/10/2021   CMP Latest Ref Rng & Units 01/10/2021  Glucose 70 - 99 mg/dL 89  BUN 6 - 20 mg/dL 17  Creatinine 0.44 - 1.00 mg/dL 0.74  Sodium 135 - 145 mmol/L 135  Potassium 3.5 - 5.1 mmol/L 4.1  Chloride 98 - 111 mmol/L 106  CO2 22 - 32 mmol/L 24  Calcium 8.9 - 10.3 mg/dL 7.9(L)  Total Protein 6.5 - 8.1 g/dL 4.4(L)  Total Bilirubin 0.3 - 1.2 mg/dL 0.4  Alkaline Phos 38 - 126 U/L 118  AST 15 - 41 U/L 31  ALT 0 - 44  U/L 26   Edinburgh Score: Edinburgh Postnatal Depression Scale Screening Tool 01/09/2021  I have been able to laugh and see the funny side of things. (No Data)     After visit meds:  Allergies as of 01/11/2021   No Known Allergies     Medication List    STOP taking these medications   cyclobenzaprine 10 MG tablet Commonly known as: FLEXERIL   metFORMIN 500 MG tablet Commonly known as: Glucophage   metroNIDAZOLE 500 MG tablet Commonly known as: FLAGYL   terconazole 0.4 % vaginal cream Commonly known as: TERAZOL 7     TAKE these medications   acetaminophen 325 MG tablet Commonly known as: TYLENOL Take 2 tablets (650 mg  total) by mouth every 6 (six) hours.   amLODipine 5 MG tablet Commonly known as: NORVASC Take 1 tablet (5 mg total) by mouth daily.   coconut oil Oil Apply 1 application topically as needed (nipple pain).   ferrous sulfate 325 (65 FE) MG tablet Take 1 tablet (325 mg total) by mouth every other day.   ibuprofen 800 MG tablet Commonly known as: ADVIL Take 1 tablet (800 mg total) by mouth every 8 (eight) hours.   oxyCODONE 5 MG immediate release tablet Commonly known as: Oxy IR/ROXICODONE Take 1 tablet (5 mg total) by mouth every 6 (six) hours as needed for severe pain or breakthrough pain.   prenatal multivitamin Tabs tablet Take 1 tablet by mouth daily at 12 noon.        Discharge home in stable condition Infant Feeding: Bottle and Breast Infant Disposition: infant in NICU Discharge instruction: per After Visit Summary and Postpartum booklet. Activity: Advance as tolerated. Pelvic rest for 6 weeks.  Diet: routine diet Future Appointments: Future Appointments  Date Time Provider Bier  01/15/2021 10:20 AM [facility] NURSE [facility]  02/19/2021  9:30 AM [facility]  02/19/2021 10:15 AM Chancy Milroy, MD [facility]   Follow up Visit: Message sent to [facility] 01/08/21 by Sylvester Harder.  Please schedule this patient for a In person postpartum visit in 6 weeks with the following provider: Any provider. Additional Postpartum F/U:2 hour GTT, Incision check 10 days and staple removal at incision check appt, BP check at time of incision check High risk pregnancy complicated by: GDM Delivery mode:  C-Section, High Vertical  Anticipated Birth Control:  BTL done PP  Dontavius Keim, Gildardo Cranker, MD OB Fellow, Faculty Practice 01/11/2021 8:50 AM

## 2021-01-08 NOTE — Anesthesia Procedure Notes (Signed)
Epidural Patient location during procedure: OB Start time: 01/08/2021 9:40 AM End time: 01/08/2021 9:53 AM  Staffing Anesthesiologist: Atilano Median, DO Performed: anesthesiologist   Preanesthetic Checklist Completed: patient identified, IV checked, site marked, risks and benefits discussed, surgical consent, monitors and equipment checked, pre-op evaluation and timeout performed  Epidural Patient position: sitting Prep: ChloraPrep Patient monitoring: heart rate, continuous pulse ox and blood pressure Approach: midline Location: L3-L4 Injection technique: LOR saline and LOR air  Needle:  Needle type: Tuohy  Needle gauge: 17 G Needle length: 9 cm Needle insertion depth: 7 cm Catheter type: closed end flexible Catheter size: 20 Guage Catheter at skin depth: 12 cm Test dose: negative and 1.5% lidocaine  Assessment Events: injection not painful, no injection resistance and no paresthesia  Additional Notes Patient identified. Risks/Benefits/Options discussed with patient including but not limited to bleeding, infection, nerve damage, paralysis, failed block, incomplete pain control, headache, blood pressure changes, nausea, vomiting, reactions to medications, itching and postpartum back pain. Confirmed with bedside nurse the patient's most recent platelet count. Confirmed with patient that they are not currently taking any anticoagulation, have any bleeding history or any family history of bleeding disorders. Patient expressed understanding and wished to proceed. All questions were answered. Sterile technique was used throughout the entire procedure. Please see nursing notes for vital signs. Test dose was given through epidural catheter and negative prior to continuing to dose epidural or start infusion. Warning signs of high block given to the patient including shortness of breath, tingling/numbness in hands, complete motor block, or any concerning symptoms with instructions to call  for help. Patient was given instructions on fall risk and not to get out of bed. All questions and concerns addressed with instructions to call with any issues or inadequate analgesia.    Reason for block:procedure for pain

## 2021-01-08 NOTE — Progress Notes (Signed)
Spanish interpreter used for the following information.  Risk benefits and alternatives of cesarean section were discussed with the patient including but not limited to infection, bleeding, damage to bowel , bladder and baby with the need for further surgery. Discussed due to prior surgery- there is a risk of bleeding and need for blood products.  Pt aware and consented.  Also discussed concern for adhesive disease and higher risk of injury due to prior adhesions.  Discussed midline vertical incision.  Questions and concerns were addressed.  Pt voiced understanding and desires to proceed.  If surgically obtainable, will also plan for bilateral tubal ligation reviewed with R&B including but not limited to bleeding, infection, injury to other organs, irreversibility and failure rate of 10/998.  Pt desires to proceed.  Myna Hidalgo, DO Attending Obstetrician & Gynecologist, [facility] for [employer], [facility]

## 2021-01-08 NOTE — MAU Note (Signed)
..  Victoria Richmond is a 35 y.o. at [redacted]w[redacted]d here in MAU reporting: SROM at 0100 clear fluid. Reports contractions every 10 minutes.  Pain score: 3/10

## 2021-01-08 NOTE — Lactation Note (Signed)
This note was copied from a baby's chart. Lactation Consultation Note Baby 8 hrs old at time of consult.  FOB translating for mom Spanish. Mom unable to latch d/t semi flat nipples. When compresses nipples flatten.  Nipples evert w/pre-pumping but very short shaft. Baby isn't able to obtain latch.  LC fitted #24 NS. Demonstrated application. Baby latched well BF for 10 minutes. Needed frequent stimulation at the end of feeding.  LC heard some occasional swallows. No "puddling" of colostrum in NS. Noted a little moisture.  Breast were slightly softer after BF. This is mom's 4th child. Mom has 33 yr old that she didn't BF, 36 yr old and 53 yr old. Mom hasn't been able to latch any of her other children. Mom pumped and bottle fed the last child.   Mom is happy to use NS to be able to latch to the breast.  Mom became very sleepy and couldn't hold her eyes open during the feeding. LC set up DEBP. Mom will have to be reviewed on how to use it when she is awake. FOB stated the have DEBP at home.  Mom has GDM. Baby LGA 10.10 lbs. Discussed w/mom before she went to sleep supplementing. Suggested Donor milk , mom chose formula. Similac 20 given w/purple nipple. Mom chooses to do breast/formula.  Shells brought to room. Mom will need to be shown how to use them when she is awake. For next feeding mom will need LC assistance. LC will try to go over more things before mom becomes to sleepy from BF.  Lactation brochure given in Spanish. Mom all ready has f/u for baby at [facility].   Patient Name: Victoria Richmond Date: 01/08/2021 Reason for consult: Initial assessment;Difficult latch;Maternal endocrine disorder Age:9 hours  Maternal Data Has patient been taught Hand Expression?: Yes Does the patient have breastfeeding experience prior to this delivery?: No How long did the patient breastfeed?: mom pumped and bottle fed 6 months  Feeding Nipple Type: Slow - flow  LATCH  Score Latch: Repeated attempts needed to sustain latch, nipple held in mouth throughout feeding, stimulation needed to elicit sucking reflex.  Audible Swallowing: A few with stimulation  Type of Nipple: Flat (very short shaft)  Comfort (Breast/Nipple): Soft / non-tender  Hold (Positioning): Full assist, staff holds infant at breast  LATCH Score: 5   Lactation Tools Discussed/Used Tools: Shells;Pump;Nipple Shields Nipple shield size: 24 Breast pump type: Double-Electric Breast Pump Pump Education:  (mom to sleepy to explain.) Reason for Pumping: NS  Interventions Interventions: Breast feeding basics reviewed;Support pillows;Assisted with latch;Position options;Skin to skin;Breast massage;Hand express;Shells;Pre-pump if needed;Hand pump;DEBP;Breast compression;Adjust position  Discharge [facility] Program: Yes  Consult Status Consult Status: Follow-up Date: 01/10/20 Follow-up type: In-patient    Charyl Dancer 01/08/2021, 8:17 PM

## 2021-01-08 NOTE — Discharge Instructions (Signed)
Cuidados en el postparto luego de un parto por cesrea Postpartum Care After Cesarean Delivery Lea esta informacin sobre cmo cuidarse desde el momento en que nazca su beb y Victoria Richmond 6 a 12 semanas despus del parto (perodo del posparto). El mdico tambin podr darle indicaciones ms especficas. Comunquese con el mdico si tiene problemas o preguntas. Siga estas indicaciones en su casa: Medicamentos  [employer] de venta libre y los recetados solamente como se lo haya indicado el mdico.  Si le recetaron un antibitico, tmelo como se lo haya indicado el mdico. No deje de tomar el antibitico aunque comience a sentirse mejor.  Pregntele al mdico si el medicamento recetado: ? Hace que sea necesario que evite conducir o usar maquinaria pesada. ? Puede causarle estreimiento. Es posible que deba tomar medidas para prevenir o tratar el estreimiento, por ejemplo:  Beber suficiente lquido como para Pharmacologist la orina de color amarillo plido.  Tomar medicamentos recetados o de [employer].  Consumir alimentos ricos en fibra, como frijoles, cereales integrales, y frutas y verduras frescas.  Limitar su consumo de alimentos ricos en grasa y azcares procesados, como los alimentos fritos o dulces. Actividad  Retome sus actividades normales de a poco segn lo indicado por el mdico.  Evite las actividades que demandan mucho esfuerzo y Engineer, drilling (que son extenuantes) hasta que el mdico se lo autorice. Siempre es ms seguro caminar a un ritmo tranquilo a moderado. Pregntele al mdico qu actividades son seguras para usted. ? No levante objetos que pesen ms que su beb o ms de 10 libras (4,5 kg), como se lo haya indicado el mdico. ? No pase la aspiradora, suba escaleras ni conduzca un vehculo durante el tiempo que le indique el mdico.  De ser posible, pdale a alguien que le brinde ayuda con las tareas domsticas hasta que pueda realizar sus actividades habituales por su  cuenta.  Descanse todo lo que pueda. Trate de descansar o tomar una siesta mientras el beb duerme. Hemorragia vaginal  Es normal tener un poco de hemorragia vaginal (loquios) despus del parto. Use un apsito sanitario para absorber el sangrado vaginal y la secrecin. ? Durante la primera semana despus del parto, la cantidad y el aspecto de los loquios a menudo es similar a las del perodo menstrual. ? Durante las siguientes semanas disminuir gradualmente hasta convertirse en una secrecin seca amarronada o [place]. ? En la [employer], los loquios se detienen [employer] 4 a 6semanas despus del [place]. Los sangrados vaginales pueden variar de mujer a Nurse, learning disability.  Cambie los apsitos sanitarios con frecuencia. Observe si hay cambios en el flujo, como: ? Aumento repentino en el volumen. ? Cambio en el color. ? Cogulos sanguneos grandes.  Si expulsa un cogulo de sangre, gurdelo y llame al mdico para informrselo. No deseche los cogulos de sangre por el inodoro antes de recibir indicaciones del mdico.  No use tampones ni se haga duchas vaginales hasta que el mdico la autorice.  Si no est amamantando, volver a tener su perodo entre 6 y 8 semanas despus del parto. Si est amamantando, puede volver a tener su perodo [place] 8 semanas despus del parto y el momento en que deje de Museum/gallery exhibitions officer. Cuidados perineales  Si su cesrea no fue planeada, y pas por el proceso de [place] de parto y puj antes del nacimiento, podra Surveyor, mining, hinchazn y Associate Professor del tejido que se encuentra entre la abertura de la vagina y el ano (perineo). Tambin pueden haberle  hecho una incisin en el tejido (episiotoma) o el tejido puede haberse desgarrado durante el parto. Siga las siguientes indicaciones como se lo haya indicado su mdico: ? Mantenga el perineo limpio y seco como se lo haya indicado el mdico. Utilice apsitos o aerosoles analgsicos y cremas, como se lo hayan  indicado. ? Si le realizaron una episiotoma o un desgarro vaginal, controle la zona todos los das para detectar signos de infeccin. Est atento a los siguientes signos:  Enrojecimiento, hinchazn o dolor.  Lquido o sangre.  Calor.  Pus o mal olor. ? Es posible que le den una botella rociadora para que use en lugar de limpiarse el rea con papel higinico despus de usar el bao. Cuando comience a sanar, podr usar la botella rociadora antes de secarse. Asegrese de secarse suavemente. ? Para aliviar el dolor causado por una episiotoma, un desgarro vaginal o hemorroides, trate de tomar un bao de asiento tibio 2 o 3 veces por da. Un bao de asiento es un bao de agua tibia que se toma mientras se est sentado. El agua solo debe llegar hasta las caderas y cubrir las nalgas.   Cuidado de las mamas  En los primeros das despus del parto, las mamas pueden sentirse pesadas, llenas e incmodas (congestin mamaria). Tambin puede tener leche que se escapa de sus senos. El mdico puede sugerirle mtodos para aliviar este malestar mamario. La congestin mamaria debera desaparecer al cabo de unos das.  Si est amamantando: ? Use un sostn que sujete y ajuste bien sus pechos. ? Mantenga los pezones secos y limpios. Aplquese cremas y ungentos como se lo haya indicado el mdico. ? Es posible que deba usar discos de algodn en el sostn para absorber la leche que se filtre. ? Puede tener contracciones uterinas cada vez que amamante durante varias semanas despus del parto. Las contracciones uterinas ayudan al tero a regresar a su tamao habitual. ? Si tiene algn problema con la lactancia materna, consulte con su mdico o con un asesor en lactancia.  Si no est amamantando: ? Evite tocarse las mamas, ya que esto puede hacer que produzcan ms leche. ? Use un sostn que le proporcione el ajuste correcto y compresas fras para reducir la hinchazn. ? No extraiga (saque) leche materna. Esto har  que produzca ms leche. Intimidad y sexualidad  Pregntele al mdico cundo puede retomar la actividad sexual. Esto puede depender de lo siguiente: ? Riesgo de sufrir una infeccin. ? Velocidad de cicatrizacin. ? Comodidad y deseo de retomar la actividad sexual.  Despus del parto, puede quedar embarazada incluso si no ha tenido todava su perodo. Si lo desea, hable con el mdico acerca de los mtodos de planificacin familiar o control de la natalidad (mtodos anticonceptivos). Estilo de vida  No consuma ningn producto que contenga nicotina o tabaco, como cigarrillos, cigarrillos electrnicos y tabaco de mascar. Si necesita ayuda para dejar de consumir, consulte al mdico.  No beba alcohol, especialmente si est amamantando. Comida y bebida  Beba suficiente lquido como para mantener la orina de color amarillo plido.  Coma alimentos ricos en fibras todos los das. Estos pueden ayudarla a prevenir o aliviar el estreimiento. Los alimentos ricos en fibra incluyen los siguientes: ? Panes y cereales integrales. ? Arroz integral. ? Frijoles. ? Frutas y verduras frescas.  Tome sus vitaminas prenatales hasta la visita de control de posparto o hasta que su mdico le indique que puede dejar de tomarlas.   Indicaciones generales  Concurra a todas las   visitas de control para usted y el beb como se lo haya indicado el mdico. La mayora de las mujeres visita al mdico para un control de posparto dentro de las primeras 3 a 6 semanas despus del [place]. Comunquese con un mdico si:  Se siente incapaz de controlar los cambios que implica tener un beb recin nacido y esos sentimientos no desaparecen.  Siente tristeza o preocupacin de forma inusual.  Siente dolor en las mamas, o estn duras o enrojecidas.  Tiene fiebre.  Tiene dificultad para retener la [place] o para impedir que la orina se escape.  Tiene poco inters o falta de inters en actividades que solan gustarle.  No ha  amamantado para nada y no ha tenido un perodo menstrual durante 12 semanas despus del [place].  Dej de amamantar al beb y no ha tenido su perodo menstrual durante 12 semanas despus de dejar de Museum/gallery exhibitions officer.  Tiene preguntas sobre su cuidado o el del beb.  Elimina un cogulo de sangre por la vagina. Solicite ayuda inmediatamente si:  Midwife.  Presenta dificultad para respirar.  Tiene un dolor repentino e intenso en la pierna.  Tiene dolor intenso o clicos en el abdomen.  Tiene un sangrado tan intenso en la vagina que empapa ms de un apsito sanitario en [employer]. El sangrado no debe ser ms abundante que el perodo ms intenso que haya tenido.  Presenta dolor de cabeza intenso.  Se desmaya.  Tiene visin borrosa o Nurse, adult.  Tiene secrecin vaginal con mal olor.  Tiene pensamientos de autolesionarse o de lesionar al beb. Si alguna vez siente que puede lastimarse o Physicist, medical a Economist, o tiene pensamientos de poner fin a su vida, busque ayuda de inmediato. Puede dirigirse al servicio de emergencias ms cercano o comunicarse con:  El servicio de emergencias de su localidad (911 en [place]  Una lnea de asistencia al suicida y Visual merchandiser en crisis, como National Suicide Prevention Lifeline (Lnea Nacional de Prevencin del Suicidio), al 2707641351. Est disponible las 24 horas del da. Resumen  El perodo de tiempo desde el parto y Victoria Richmond 6 a 12 semanas despus del parto se denomina perodo posparto.  Retome sus actividades normales de a poco segn lo indicado por el mdico.  Concurra a todas las visitas de control para usted y Research scientist (physical sciences) se lo haya indicado el mdico. Esta informacin no tiene Theme park manager el consejo del mdico. Asegrese de hacerle al mdico cualquier pregunta que tenga. Document Revised: 06/24/2018 Document Reviewed: 06/24/2018 Elsevier Patient Education  2021 [employer].

## 2021-01-08 NOTE — Anesthesia Preprocedure Evaluation (Addendum)
Anesthesia Evaluation  Patient identified by MRN, date of birth, ID band Patient awake    Airway Mallampati: II  TM Distance: >3 FB Neck ROM: Full    Dental  (+) Teeth Intact   Pulmonary neg pulmonary ROS,    Pulmonary exam normal        Cardiovascular negative cardio ROS   Rhythm:Regular Rate:Normal     Neuro/Psych negative neurological ROS  negative psych ROS   GI/Hepatic negative GI ROS, Neg liver ROS,   Endo/Other  diabetes, Gestational, Oral Hypoglycemic Agents  Renal/GU negative Renal ROS  negative genitourinary   Musculoskeletal negative musculoskeletal ROS (+)   Abdominal (+)  Abdomen: soft. Bowel sounds: normal.  Peds  Hematology  (+) anemia ,   Anesthesia Other Findings   Reproductive/Obstetrics (+) Pregnancy                            Anesthesia Physical Anesthesia Plan  ASA: II  Anesthesia Plan: Combined Spinal and Epidural   Post-op Pain Management:    Induction:   PONV Risk Score and Plan: 2 and Ondansetron, Dexamethasone and Treatment may vary due to age or medical condition  Airway Management Planned: Simple Face Mask, Natural Airway and Nasal Cannula  Additional Equipment: None  Intra-op Plan:   Post-operative Plan:   Informed Consent: I have reviewed the patients History and Physical, chart, labs and discussed the procedure including the risks, benefits and alternatives for the proposed anesthesia with the patient or authorized representative who has indicated his/her understanding and acceptance.     Dental advisory given  Plan Discussed with: CRNA  Anesthesia Plan Comments: (- C/S 2015 complicated by extensive adhesions and bladder trauma. Recommendations per OB team for CSE given history. Discussed with Dr. Charlotta Newton DOS, mutual agreement for planned CSE.   Lab Results      Component                Value               Date                      WBC                       7.3                 01/08/2021                HGB                      12.8                01/08/2021                HCT                      38.4                01/08/2021                MCV                      91.0                01/08/2021                PLT  126 (L)             01/08/2021           Lab Results      Component                Value               Date                      NA                       136                 01/05/2021                K                        4.3                 01/05/2021                CO2                      22                  01/05/2021                GLUCOSE                  149 (H)             01/05/2021                BUN                      7                   01/05/2021                CREATININE               0.74                01/05/2021                CALCIUM                  9.1                 01/05/2021                GFRNONAA                 >60                 01/05/2021                GFRAA                    136                 10/02/2017          )       Anesthesia Quick Evaluation

## 2021-01-08 NOTE — Lactation Note (Signed)
This note was copied from a baby's chart. Lactation Consultation Note Baby 12 hrs old. Has low glucose 35. LC got mom pumping w/DEBP then hand expressed after pumping collected 0.5 ml. Spoon fed baby.  RN giving gel and formula.  Mom very sleepy. To sleepy to hold baby STS. LC will f/u again for  Next feeding.  Patient Name: Girl Nykayla Marcelli [id number] Date: 01/08/2021 Reason for consult: Follow-up assessment;Maternal endocrine disorder Age:36 hours  Maternal Data Has patient been taught Hand Expression?: Yes Does the patient have breastfeeding experience prior to this delivery?: No How long did the patient breastfeed?: mom pumped and bottle fed 6 months  Feeding Nipple Type: Slow - flow  LATCH Score Latch: Repeated attempts needed to sustain latch, nipple held in mouth throughout feeding, stimulation needed to elicit sucking reflex.  Audible Swallowing: A few with stimulation  Type of Nipple: Flat (very short shaft)  Comfort (Breast/Nipple): Soft / non-tender  Hold (Positioning): Full assist, staff holds infant at breast  LATCH Score: 5   Lactation Tools Discussed/Used Tools: Shells;Pump;Nipple Shields Nipple shield size: 24 Breast pump type: Double-Electric Breast Pump Pump Education: Setup, frequency, and cleaning;Milk Storage Reason for Pumping: NS Pumping frequency: Q3 Pumped volume: 0 mL  Interventions Interventions: DEBP;Breast compression;Breast massage;Hand express;Expressed milk  Discharge WIC Program: Yes  Consult Status Consult Status: Follow-up Date: 01/09/21 Follow-up type: In-patient    Charyl Dancer 01/08/2021, 11:10 PM

## 2021-01-08 NOTE — H&P (Signed)
Victoria Richmond is a 35 y.o. female G4P3003 IUP 39 1/7 weeks presenting for SROM. Pt reports SROM @ 1 AM. Prenatal care complicated by GDM, A2, controlled and polyhydramnios, and prior c section x 3. 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  Pt last ate soup at 11 PM. OB History    Gravida  4   Para  3   Term  3   Preterm  0   AB  0   Living  3     SAB  0   IAB  0   Ectopic  0   Multiple  0   Live Births  3          Past Medical History:  Diagnosis Date  . Anemia   . Breast pain, left 12/08/2012   [facility] for left breast ultrasound. Appointment scheduled for Monday, December 14, 2012 at 0850.  Marland Kitchen Complication of anesthesia    not numb at start of first CS  . Gestational diabetes   . History of gestational diabetes mellitus 04/23/2016  . Pyelonephritis    Past Surgical History:  Procedure Laterality Date  . BLADDER NECK RECONSTRUCTION N/A 01/29/2014   Procedure: Repair of Cystotomy;  Surgeon: Tereso Newcomer, MD;  Location: [facility];  Service: Obstetrics;  Laterality: N/A;  . CESAREAN SECTION    . CESAREAN SECTION N/A 01/29/2014   Procedure: CESAREAN SECTION;  Surgeon: Tereso Newcomer, MD;  Location: [facility];  Service: Obstetrics;  Laterality: N/A;   Family History: family history includes Diabetes in her mother and paternal aunt. Social History:  reports that she has never smoked. She has never used smokeless tobacco. She reports that she does not drink alcohol and does not use drugs.     Maternal Diabetes: Yes:  Diabetes Type:  Insulin/Medication controlled Genetic Screening: Declined Maternal Ultrasounds/Referrals: Normal Fetal Ultrasounds or other Referrals:  None Maternal Substance Abuse:  No Significant Maternal Medications:  None Significant Maternal Lab Results:  Group B Strep negative Other Comments:  None  Review of Systems  Constitutional: Negative.   Respiratory:  Negative.   Cardiovascular: Negative.   Gastrointestinal: Positive for abdominal pain.  Genitourinary: Positive for pelvic pain.   History Dilation: Fingertip Effacement (%): 50 Station: -3 Exam by:: A. Sedano, RN Blood pressure 125/78, pulse 72, temperature 98.3 F (36.8 C), temperature source Oral, resp. rate 19, last menstrual period 04/09/2020, SpO2 98 %. Exam Physical Exam Constitutional:      Appearance: Normal appearance.  Cardiovascular:     Rate and Rhythm: Normal rate and regular rhythm.  Pulmonary:     Effort: Pulmonary effort is normal.     Breath sounds: Normal breath sounds.  Abdominal:     General: Bowel sounds are normal.     Palpations: Abdomen is soft.     Comments: gravid  Genitourinary:    Comments: Grossly ruptured and fern positive by nursing, 1 cm Neurological:     Mental Status: She is alert.     Prenatal labs: ABO, Rh: --/--/O POS (04/01 1478) Antibody: POS (04/01 2956) Rubella: Immune (09/27 0000) RPR: NON REACTIVE (04/01 0930)  HBsAg: Negative (09/27 0000)  HIV: Non Reactive (02/04 0000)  GBS: Negative/-- (03/17 0915)   Assessment/Plan: IUP 39 1/7 weeks SROM Prior c section with H/O bladder laceration and severe pelvic adhesive Dz. Unwanted fertility    Admit for Repeat c section and BTL. Pt was scheduled for today at  11 AM. Will move to 9:15 AM.  The risks of cesarean section discussed with the patient included but were not limited to: bleeding which may require transfusion or reoperation; infection which may require antibiotics; injury to bowel, bladder, ureters or other surrounding organs; injury to the fetus; need for additional procedures including hysterectomy in the event of a life-threatening hemorrhage; placental abnormalities wth subsequent pregnancies, incisional problems, thromboembolic phenomenon and other postoperative/anesthesia complications. The patient concurred with the proposed plan, giving informed written consent for  the procedure.   Anesthesia and OR aware. Preoperative prophylactic antibiotics and SCDs ordered on call to the OR.  To OR when ready.  Patient desires bilateral tubal sterilization.  Other reversible forms of contraception were discussed with patient; she declines all other modalities. Discussed bilateral tubal sterilization in detail; discussed options of laparoscopic bilateral tubal sterilization using Filshie clips vs laparoscopic bilateral salpingectomy. Risks and benefits discussed in detail including but not limited to: risk of regret, permanence of method, bleeding, infection, injury to surrounding organs and need for additional procedures.  Failure risk of 1-2 % for Filshie clips and <1% for bilateral salpingectomy with increased risk of ectopic gestation if pregnancy occurs was also discussed with patient.  Also discussed possible reduction of risk of ovarian cancer via bilateral salpingectomy given that a growing body of knowledge reveals that the majority of cases of high grade serous "ovarian" cancer actually are actually  cancers arising from the fimbriated end of the fallopian tubes. Emphasized that removal of fallopian tubes do not result in any known hormonal imbalance.  Patient verbalized understanding of these risks and benefits and wants to proceed with sterilization with  using Filshie clips.      Victoria Richmond 01/08/2021, 2:34 AM

## 2021-01-08 NOTE — Transfer of Care (Signed)
Immediate Anesthesia Transfer of Care Note  Patient: Victoria Richmond  Procedure(s) Performed: CESAREAN SECTION WITH BILATERAL TUBAL LIGATION (N/A ) APPLICATION OF CELL SAVER (N/A )  Patient Location: PACU  Anesthesia Type:CSE  Level of Consciousness: awake, alert  and oriented  Airway & Oxygen Therapy: Patient Spontanous Breathing  Post-op Assessment: Report given to RN and Post -op Vital signs reviewed and stable  Post vital signs: Reviewed and stable  Last Vitals:  Vitals Value Taken Time  BP 90/59 01/08/21 1150  Temp    Pulse 83 01/08/21 1153  Resp 14 01/08/21 1153  SpO2 100 % 01/08/21 1153  Vitals shown include unvalidated device data.  Last Pain:  Vitals:   01/08/21 0717  TempSrc: Oral  PainSc:          Complications: No complications documented.

## 2021-01-08 NOTE — Op Note (Signed)
PreOp Diagnosis:  1) prior C-section x 3 2) Intrauterine pregnancy @ [redacted]w[redacted]d 3) Early labor 4) GDMA2 5) Desire for permanent sterilization PostOp Diagnosis: same Procedure: Repeat C-section- classicial incision, midline vertical skin incision, bilateral salpingectomy Surgeon: Dr. Myna Hidalgo Assistant: Dr. Mart Piggs, Dr. Jaynie Collins Anesthesia: combined spinal/epidural Complications: none EBL: 371cc UOP: 600cc Fluids: 1600cc  INDICATIONS: Prior C-section with know adhesive disease  Findings: Female infant from vertex presentation.  Omental adhesion noted to the posterior aspect of the fundus.  Ovaries adherent to the posterior portion of broad ligament and posterior uterus.  Midbody and fundal portion of the uterus free of adhesion.  Small adhesion noted between omentum and lower anterior abdominal wall.  Dense adhesions noted lower uterine segment, bladder and anterior abdominal wall.  PROCEDURE:  Informed consent was obtained from the patient with risks, benefits, complications, treatment options, and expected outcomes discussed with the patient.  The patient concurred with the proposed plan, giving informed consent with form signed.   The patient was taken to Operating Room, and identified with the procedure verified as C-Section Delivery with Time Out. With induction of anesthesia, the patient was prepped and draped in the usual sterile fashion. A midline vertical incision was made and carried down through the subcutaneous tissue to the fascia. The fascia was incised in the midline and extended with the scalpel and Mayo scissors. The peritoneum was identified and entered bluntly.  A small omental adhesion was noted to the anterior abdominal wall and ligated with interrupted chromic and the bovie.  Exploration of the abdomen was completed with the finding as noted above.  The Jon Gills was placed.  A midline uterine incision was made with care to avoid the lower uterine segment. The  infants head delivered with the assistance of a Kiwi vacuum. After the umbilical cord was clamped and cut cord blood was obtained for evaluation. The placenta was removed intact and appeared normal. The uterus was exteriorized.  The uterine incision was closed with running locked sutures of 0 Vicryl in 2 layers.  The third layer was closed with interrupted figure of eight using 0-vicryl.  The serosa was closed in a running fashion using 3-0 monofilament.  Excellent hemostasis was obtained.  Attention was then turned to the left fallopian tube.  The left fallopian tube was identified and followed to its fimbriated end. A kelly clamp was placed on the lower third of the fallopian tube. This portion of the tube was cut and ligated with a free tie of 0 plain gut.  An additional Haney stitch was placed and excellent hemostasis was obtained.  A similar procedure was carried out on the opposite side.  The uterus was replaced.  Surgicel was placed.  The fascia was then reapproximated with PDS.  The subcutaneous tissue was reapproximated with 2-0 plain gut suture.  The skin was closed with staples.  Instrument, sponge, and needle counts were correct prior the abdominal closure and at the conclusion of the case. The patient was taken to recovery in stable condition.  Myna Hidalgo, DO Attending Obstetrician & Gynecologist, [facility] for [employer], [facility]

## 2021-01-08 NOTE — MAU Note (Signed)
Kris Hartmann, RN, stated once Anesthesia consents the patient in MAU they will call when the OR is ready.

## 2021-01-09 DIAGNOSIS — D649 Anemia, unspecified: Secondary | ICD-10-CM

## 2021-01-09 DIAGNOSIS — O9903 Anemia complicating the puerperium: Secondary | ICD-10-CM

## 2021-01-09 LAB — CBC
HCT: 26.1 % — ABNORMAL LOW (ref 36.0–46.0)
Hemoglobin: 9 g/dL — ABNORMAL LOW (ref 12.0–15.0)
MCH: 30.5 pg (ref 26.0–34.0)
MCHC: 34.5 g/dL (ref 30.0–36.0)
MCV: 88.5 fL (ref 80.0–100.0)
Platelets: 89 10*3/uL — ABNORMAL LOW (ref 150–400)
RBC: 2.95 MIL/uL — ABNORMAL LOW (ref 3.87–5.11)
RDW: 14.9 % (ref 11.5–15.5)
WBC: 12.1 10*3/uL — ABNORMAL HIGH (ref 4.0–10.5)
nRBC: 0 % (ref 0.0–0.2)

## 2021-01-09 LAB — GLUCOSE, CAPILLARY: Glucose-Capillary: 139 mg/dL — ABNORMAL HIGH (ref 70–99)

## 2021-01-09 MED ORDER — FERROUS SULFATE 325 (65 FE) MG PO TABS
325.0000 mg | ORAL_TABLET | ORAL | Status: DC
  Administered 2021-01-09 – 2021-01-11 (×2): 325 mg via ORAL
  Filled 2021-01-09 (×2): qty 1

## 2021-01-09 NOTE — Progress Notes (Addendum)
POSTPARTUM PROGRESS NOTE  Subjective: Victoria Richmond is a 35 y.o. T2I7124 s/p repeat classical CS at [redacted]w[redacted]d.  She reports she doing well. No acute events overnight. Foley catheter still in place but pt tolerating po intake well. Denies nausea or vomiting. She has not yet passed flatus. Pain is well controlled.  Lochia is minimal.  Objective: Blood pressure 126/67, pulse 60, temperature 98.1 F (36.7 C), temperature source Oral, resp. rate 16, last menstrual period 04/09/2020, SpO2 97 %, unknown if currently breastfeeding.  Physical Exam:  General: alert, cooperative and no distress Chest: no respiratory distress Abdomen: soft, non-tender  Uterine Fundus: firm and at level of umbilicus, honeycomb dressing c/d/i. Extremities: No calf swelling or tenderness  1+ bilateral LE edema Neuro: A&Ox3. No focal neuro deficits.  Recent Labs    01/08/21 0159 01/09/21 0505  HGB 12.8 9.0*  HCT 38.4 26.1*    Assessment/Plan: Victoria Richmond is a 35 y.o. [id number] s/p repeat classical CS at [redacted]w[redacted]d.   Routine Postpartum Care: Doing well, pain well-controlled.  -- Continue routine care, lactation support  -- Contraception: s/p postpartum BTL -- Feeding: breast & formula -- Anemia: Hgb 12.8 > 9.0. Will continue po iron every other day.  Dispo: Plan for discharge POD#2-3.  Sheila Oats, MD OB Fellow, [facility] 01/09/2021 6:17 AM

## 2021-01-09 NOTE — Lactation Note (Signed)
This note was copied from a baby's chart. Lactation Consultation Note  Patient Name: Victoria Richmond [id number] Date: 01/09/2021 Reason for consult: NICU baby;Follow-up assessment Age:36 hours  LC to infant's room for f/u visit. Interpreter services assisted. Mom states she has no milk. Reviewed normalcy on day 2 and encouraged her to use the pump until baby is able to bf. Reviewed with mom that her RN can assist on MBU prn. Will plan f/u visit.  Feeding Mother's Current Feeding Choice: Formula Nipple Type: Slow - flow  Interventions Interventions: Education;DEBP   Consult Status Consult Status: Follow-up Follow-up type: In-patient   Victoria Negus, MA IBCLC 01/09/2021, 3:41 PM

## 2021-01-09 NOTE — Progress Notes (Signed)
Patient screened out for psychosocial assessment since none of the following apply: °Psychosocial stressors documented in mother or baby's chart °Gestation less than 32 weeks °Code at delivery  °Infant with anomalies °Please contact the Clinical Social Worker if specific needs arise, by MOB's request, or if MOB scores greater than 9/yes to question 10 on Edinburgh Postpartum Depression Screen. ° °Delon Revelo Boyd-Gilyard, MSW, LCSW °Clinical Social Work °[phone] °  °

## 2021-01-09 NOTE — Progress Notes (Signed)
Went to check on pt. FOB expressed concerns of patients saying inappropriate things the last hour. Certified spanish speaking RN came in room with the patients nurse to check on pt and to see if she needed anything. She asked pt if she knew where she was and pt replied "at a baby shower".  She asked patient if she knew what year it was and the pt replied "2002". Pt able to say her name and date of birth correctly. Pt also able to follow commands appropriately. FOB and RN concerned because this was new behavior for her. No other finding noted at this time and Vital signs WNL. Dr. Barb Merino notified and plans to come see pt.

## 2021-01-10 DIAGNOSIS — O135 Gestational [pregnancy-induced] hypertension without significant proteinuria, complicating the puerperium: Secondary | ICD-10-CM

## 2021-01-10 DIAGNOSIS — D696 Thrombocytopenia, unspecified: Secondary | ICD-10-CM

## 2021-01-10 DIAGNOSIS — O9913 Other diseases of the blood and blood-forming organs and certain disorders involving the immune mechanism complicating the puerperium: Secondary | ICD-10-CM

## 2021-01-10 LAB — CBC
HCT: 27.2 % — ABNORMAL LOW (ref 36.0–46.0)
Hemoglobin: 9.2 g/dL — ABNORMAL LOW (ref 12.0–15.0)
MCH: 30.7 pg (ref 26.0–34.0)
MCHC: 33.8 g/dL (ref 30.0–36.0)
MCV: 90.7 fL (ref 80.0–100.0)
Platelets: 105 10*3/uL — ABNORMAL LOW (ref 150–400)
RBC: 3 MIL/uL — ABNORMAL LOW (ref 3.87–5.11)
RDW: 15.5 % (ref 11.5–15.5)
WBC: 9.4 10*3/uL (ref 4.0–10.5)
nRBC: 0 % (ref 0.0–0.2)

## 2021-01-10 LAB — COMPREHENSIVE METABOLIC PANEL
ALT: 26 U/L (ref 0–44)
AST: 31 U/L (ref 15–41)
Albumin: 1.7 g/dL — ABNORMAL LOW (ref 3.5–5.0)
Alkaline Phosphatase: 118 U/L (ref 38–126)
Anion gap: 5 (ref 5–15)
BUN: 17 mg/dL (ref 6–20)
CO2: 24 mmol/L (ref 22–32)
Calcium: 7.9 mg/dL — ABNORMAL LOW (ref 8.9–10.3)
Chloride: 106 mmol/L (ref 98–111)
Creatinine, Ser: 0.74 mg/dL (ref 0.44–1.00)
GFR, Estimated: >60 mL/min (ref >60)
Glucose, Bld: 89 mg/dL (ref 70–99)
Potassium: 4.1 mmol/L (ref 3.5–5.1)
Sodium: 135 mmol/L (ref 135–145)
Total Bilirubin: 0.4 mg/dL (ref 0.3–1.2)
Total Protein: 4.4 g/dL — ABNORMAL LOW (ref 6.5–8.1)

## 2021-01-10 LAB — SURGICAL PATHOLOGY

## 2021-01-10 LAB — GLUCOSE, CAPILLARY: Glucose-Capillary: 83 mg/dL (ref 70–99)

## 2021-01-10 MED ORDER — AMLODIPINE BESYLATE 5 MG PO TABS
5.0000 mg | ORAL_TABLET | Freq: Every day | ORAL | Status: DC
  Administered 2021-01-10 – 2021-01-11 (×2): 5 mg via ORAL
  Filled 2021-01-10 (×2): qty 1

## 2021-01-10 NOTE — Progress Notes (Addendum)
POSTPARTUM PROGRESS NOTE  Subjective: Victoria Richmond is a 35 y.o. F7P1025 s/p repeat classical CS at [redacted]w[redacted]d.  She reports she doing well. No acute events overnight.  Pain is moderately controlled with medications. Lochia is minimal.  Objective: Blood pressure 140/72, pulse 60, temperature 97.8 F (36.6 C), temperature source Oral, resp. rate 18, last menstrual period 04/09/2020, SpO2 100 %, unknown if currently breastfeeding.  Physical Exam:  General: alert, cooperative and no distress Chest: no respiratory distress Abdomen: soft, non-tender, honeycomb partially saturated. Uterine Fundus: firm and at level of umbilicus Extremities: no tenderness, 1+ pitting edema in bilateral lower extremities  Recent Labs    01/09/21 0505 01/10/21 0602  HGB 9.0* 9.2*  HCT 26.1* 27.2*    Assessment/Plan: Victoria Richmond is a 35 y.o. E5I7782 s/p repeat classical CS at [redacted]w[redacted]d.  Routine Postpartum Care: Doing well, pain well-controlled. Baby currently in NICU. -- gHTN:  Given multiple mild range blood pressures, started amlodipine 5 mg daily.  -- Contraception: s/p BTL -- Feeding: breast & formula -- Anemia: Hgb 12.8 > 9.0 > 9.2. Will continue po iron every other day. -- Thrombocytopenia: Platelets 89>105 today. Will continue lovenox as previously ordered.  Dispo: Plan for discharge POD #3 given elevated blood pressures and baby in NICU.  Jovita Kussmaul, MD PGY1 01/10/2021 8:32 AM  Attestation of Supervision of Resident:  I confirm that I have verified the information documented in the  resident's  note and that I have also personally reperformed the history, physical exam and all medical decision making activities.  I have verified that all services and findings are accurately documented in this student's note; and I agree with management and plan as outlined in the documentation. I have also made any necessary editorial changes.  Sheila Oats, MD Center for [facility],  [facility] 01/10/2021 8:59 AM

## 2021-01-10 NOTE — Lactation Note (Addendum)
This note was copied from a baby's chart. Lactation Consultation Note  Patient Name: Victoria Richmond [id number] Date: 01/10/2021 Reason for consult: Follow-up assessment;NICU baby Age:36 hours  Consult was done in Spanish:  Follow up visit to 59 hours old infant. Mother states she has been pumping. She reports collecting only ~25mL of EBM per pumping. Mother brought 58mL of EBM to NICU. Mother explains she had low milk supply issues with her other children, too.    Encouraged mother to pump at least 8 times a day including night time. Discussed impact of changes in milk supply. Reinforced the importance of self-care and its influence in milk supply. Mother verbalizes in agreement.   Encouraged to request [facility] for any needs, support or questions. Praised mother for her milk supply, effort and dedication.    Maternal Data Does the patient have breastfeeding experience prior to this delivery?: Yes How long did the patient breastfeed?: 3-4 weeks  Feeding Mother's Current Feeding Choice: Breast Milk and Formula  Lactation Tools Discussed/Used Tools: Pump Breast pump type: Double-Electric Breast Pump Reason for Pumping: maternal infant separation Pumping frequency: Q3 Pumped volume: 7 mL (Mother brought 10mL (2 pumping sessions combined) for this feeding)  Interventions Interventions: DEBP;Expressed milk;Education;Breast feeding basics reviewed  Discharge WIC Program: Yes  Consult Status Consult Status: Follow-up Date: 01/11/21 Follow-up type: In-patient    [name] 01/10/2021, 10:35 PM

## 2021-01-11 ENCOUNTER — Other Ambulatory Visit (HOSPITAL_COMMUNITY): Payer: Self-pay

## 2021-01-11 ENCOUNTER — Ambulatory Visit: Payer: Self-pay

## 2021-01-11 DIAGNOSIS — O139 Gestational [pregnancy-induced] hypertension without significant proteinuria, unspecified trimester: Secondary | ICD-10-CM

## 2021-01-11 MED ORDER — IBUPROFEN 800 MG PO TABS
800.0000 mg | ORAL_TABLET | Freq: Three times a day (TID) | ORAL | 0 refills | Status: DC
  Filled 2021-01-11: qty 30, 10d supply, fill #0

## 2021-01-11 MED ORDER — ACETAMINOPHEN 325 MG PO TABS: 650.0000 mg | ORAL_TABLET | Freq: Four times a day (QID) | ORAL | Status: DC

## 2021-01-11 MED ORDER — FERROUS SULFATE 325 (65 FE) MG PO TABS
325.0000 mg | ORAL_TABLET | ORAL | 2 refills | Status: DC
Start: 2021-01-11 — End: 2021-02-19
  Filled 2021-01-11: qty 60, 120d supply, fill #0

## 2021-01-11 MED ORDER — COCONUT OIL OIL: 1.0000 "application " | TOPICAL_OIL | 0 refills | Status: DC | PRN

## 2021-01-11 MED ORDER — AMLODIPINE BESYLATE 5 MG PO TABS
5.0000 mg | ORAL_TABLET | Freq: Every day | ORAL | 0 refills | Status: DC
  Filled 2021-01-11: qty 45, 45d supply, fill #0

## 2021-01-11 MED ORDER — FUROSEMIDE 20 MG PO TABS
20.0000 mg | ORAL_TABLET | Freq: Once | ORAL | Status: AC
  Administered 2021-01-11: 20 mg via ORAL
  Filled 2021-01-11: qty 1

## 2021-01-11 MED ORDER — OXYCODONE HCL 5 MG PO TABS
5.0000 mg | ORAL_TABLET | Freq: Four times a day (QID) | ORAL | 0 refills | Status: DC | PRN
  Filled 2021-01-11: qty 15, 4d supply, fill #0

## 2021-01-11 NOTE — Lactation Note (Signed)
This note was copied from a baby's chart. Lactation Consultation Note  Patient Name: Victoria Richmond [id number] Date: 01/11/2021 Reason for consult: Follow-up assessment;NICU baby Age:36 hours Consult was done in Spanish:  Follow up visit to P4 mother of 65 hours old currently in NICU. Mother states only pumped twice today and last pumping session she collected ~50mL. Mother has not been pumping at night. Discussed the importance of pumping 8-12 times in 24 h including night time. Talked about breast massage prior to pumping. Discussed using DEBP while in baby's room. LC shared lactation services while baby remains in NICU.   Plan: 1-Pump 8-12 times x 24h for breast stimulation 2-Promoted maternal self care 3-Contact LC as needed for feeds/support/concerns/questions   Mother will be discharged today.  Maternal Data Has patient been taught Hand Expression?: Yes Does the patient have breastfeeding experience prior to this delivery?: Yes  Feeding Mother's Current Feeding Choice: Breast Milk and Formula  Lactation Tools Discussed/Used Tools: Pump Breast pump type: Double-Electric Breast Pump Pump Education: Setup, frequency, and cleaning;Milk Storage Reason for Pumping: maternal infant separation Pumping frequency: Q3 Pumped volume: 15 mL  Interventions Interventions: Breast feeding basics reviewed;Education;Expressed milk;DEBP  Discharge Discharge Education: Engorgement and breast care;Warning signs for feeding baby  Consult Status Consult Status: Follow-up Date: 01/12/21 Follow-up type: In-patient    Victoria Richmond 01/11/2021, 2:42 PM

## 2021-01-12 LAB — BPAM RBC
Blood Product Expiration Date: 202205072359
Blood Product Expiration Date: 202205072359
Unit Type and Rh: 5100
Unit Type and Rh: 5100

## 2021-01-12 LAB — TYPE AND SCREEN
ABO/RH(D): O POS
Antibody Screen: POSITIVE
Unit division: 0
Unit division: 0

## 2021-01-12 MED FILL — Heparin Sodium (Porcine) Inj 1000 Unit/ML: INTRAMUSCULAR | Qty: 30 | Status: AC

## 2021-01-12 MED FILL — Sodium Chloride IV Soln 0.9%: INTRAVENOUS | Qty: 1000 | Status: AC

## 2021-01-13 ENCOUNTER — Ambulatory Visit: Payer: Self-pay

## 2021-01-13 NOTE — Lactation Note (Signed)
This note was copied from a baby's chart. Lactation Consultation Note  Patient Name: Girl Jace Dowe [id number] Date: 01/13/2021 Reason for consult: Follow-up assessment;NICU baby Age:36 days   Mom and dad in room with infant.  Interpreter at bedside.  Mom just pumped 3.5 ounces approximately 10 minutes ago.  Mom states she did not have her milk come in like this with her previous children.  Mom states she cannot get an appointment with WIC until the end of the month which was when she was told to call WIC back.  Mom does have a hand pump.  Engorgement prevention reviewed.  She inquired about types of formula to feed her infant and was advised to ask her pediatrician.  LC praised mom for pumping efforts.  Mom's goal is to breastfeed and formula feed.  She will bottle feed formula when going back to work.  All questions addressed prior to DC.  Maternal Data    Feeding Mother's Current Feeding Choice: Breast Milk and Formula Nipple Type: Slow - flow  LATCH Score                    Lactation Tools Discussed/Used Tools: Pump Breast pump type: Double-Electric Breast Pump;Manual Pumping frequency: encouraged mom to pump each time the infant feeds. Pumped volume: 3.5 mL (ounces)  Interventions Interventions: Breast feeding basics reviewed;Education  Discharge Pump: DEBP;Manual  Consult Status Consult Status: Complete Date: 01/13/21 Follow-up type: In-patient    Maryruth Hancock [facility] 01/13/2021, 12:09 PM

## 2021-01-15 ENCOUNTER — Encounter: Payer: Self-pay | Admitting: Lactation Services

## 2021-01-15 ENCOUNTER — Ambulatory Visit (INDEPENDENT_AMBULATORY_CARE_PROVIDER_SITE_OTHER): Payer: Self-pay | Admitting: Lactation Services

## 2021-01-15 VITALS — BP 130/82 | HR 69 | Ht <= 58 in | Wt 147.2 lb

## 2021-01-15 DIAGNOSIS — Z4889 Encounter for other specified surgical aftercare: Secondary | ICD-10-CM

## 2021-01-15 NOTE — Progress Notes (Signed)
Agree with A & P. 

## 2021-01-15 NOTE — Progress Notes (Signed)
Spoke with patient with assistance of Raquel, In person [employer] officer, [employer].   Patient her would visit check post c/s on 4/4. Patient with vertical incision with staples. Reviewed with Dr. Alysia Penna who recommended removing staples and then applying Steri-strips. Incision clean, dry and intact. No bleeding, redness or swelling noted. Incision well approximated.   Patient advised to leave incision open to air and she can take showers, allowing soapy water to run over incision and pat dry. No rubbing to incision. She was advised to allow steri-strips to come off as they want to and not to pull off until loose. She will have follow up nurse visit in 7-10 days.   Patient asking about 2 hour Glucose test, reviewed will be completed at [facility] appt. Patient voiced understanding.   Patients questions answered. She is to call with any questions or concerns as needed. Patient had questions about [facility] application- referred to front office staff.

## 2021-01-23 ENCOUNTER — Other Ambulatory Visit: Payer: Self-pay

## 2021-01-23 ENCOUNTER — Other Ambulatory Visit (HOSPITAL_COMMUNITY)
Admission: RE | Admit: 2021-01-23 | Discharge: 2021-01-23 | Disposition: A | Discharge status: Home / Self Care | Payer: Self-pay | Source: Ambulatory Visit | Attending: Family Medicine | Admitting: Family Medicine

## 2021-01-23 ENCOUNTER — Ambulatory Visit (INDEPENDENT_AMBULATORY_CARE_PROVIDER_SITE_OTHER): Payer: Self-pay

## 2021-01-23 VITALS — BP 111/76 | HR 67 | Wt 142.4 lb

## 2021-01-23 DIAGNOSIS — N898 Other specified noninflammatory disorders of vagina: Secondary | ICD-10-CM

## 2021-01-23 DIAGNOSIS — Z5189 Encounter for other specified aftercare: Secondary | ICD-10-CM

## 2021-01-23 NOTE — Progress Notes (Signed)
Incision Check   Here today for incision check following repeat c-section on 01/08/21. Incision is covered with steri strips. Steri strips removed. Incision is clean, dry, with well approximated edges. Small area of pink, granulation tissue to lower edge of incision. Pt reports this area is painful and burning. Anyanwu, MD to bedside to assess and apply silver nitrate to the area. Good wound care and s/s of infection reviewed with pt.   Complains of watery vaginal discharge with odor. Sometimes having brown spotting. Self-swab collected. Explained we will contact pt with any abnormal results. Explained this may be an odor caused by old blood or may be an infection.  Pt reports continuing amlodipine 5 mg daily for gestational hypertension following c-section; BP is in normal range today. Currently breastfeeding, states she is having some issue with baby latching and staying awake at breast. Currently pumping. Agreeable to appt with [facility]. Will follow up with provider for [facility] visit on 02/19/21  Fleet Contras RN 01/23/21

## 2021-01-24 ENCOUNTER — Other Ambulatory Visit: Payer: Self-pay | Admitting: Obstetrics & Gynecology

## 2021-01-24 DIAGNOSIS — B9689 Other specified bacterial agents as the cause of diseases classified elsewhere: Secondary | ICD-10-CM

## 2021-01-24 LAB — CERVICOVAGINAL ANCILLARY ONLY
Bacterial Vaginitis (gardnerella): POSITIVE — AB
Candida Glabrata: NEGATIVE
Candida Vaginitis: NEGATIVE
Chlamydia: NEGATIVE
Comment: NEGATIVE
Comment: NEGATIVE
Comment: NEGATIVE
Comment: NEGATIVE
Comment: NEGATIVE
Comment: NORMAL
Neisseria Gonorrhea: NEGATIVE
Trichomonas: NEGATIVE

## 2021-01-24 MED ORDER — METRONIDAZOLE 500 MG PO TABS: 500.0000 mg | ORAL_TABLET | Freq: Two times a day (BID) | ORAL | 0 refills | Status: AC

## 2021-01-24 NOTE — Progress Notes (Signed)
Patient was assessed and managed by nursing staff during this encounter. I have reviewed the chart and agree with the documentation and plan. I also applied silver nitrate to small area of granulation tissue.  Patient to follow up as scheduled.  Jaynie Collins, MD 01/24/2021 8:23 AM

## 2021-02-15 ENCOUNTER — Other Ambulatory Visit: Payer: Self-pay | Admitting: *Deleted

## 2021-02-15 DIAGNOSIS — O24429 Gestational diabetes mellitus in childbirth, unspecified control: Secondary | ICD-10-CM

## 2021-02-19 ENCOUNTER — Encounter: Payer: Self-pay | Admitting: Family Medicine

## 2021-02-19 ENCOUNTER — Encounter: Payer: Self-pay | Admitting: Obstetrics and Gynecology

## 2021-02-19 ENCOUNTER — Ambulatory Visit (INDEPENDENT_AMBULATORY_CARE_PROVIDER_SITE_OTHER): Payer: Self-pay | Admitting: Obstetrics and Gynecology

## 2021-02-19 ENCOUNTER — Other Ambulatory Visit: Payer: Self-pay

## 2021-02-19 VITALS — BP 111/75 | HR 74 | Ht 59.0 in | Wt 142.2 lb

## 2021-02-19 DIAGNOSIS — O139 Gestational [pregnancy-induced] hypertension without significant proteinuria, unspecified trimester: Secondary | ICD-10-CM

## 2021-02-19 DIAGNOSIS — O24429 Gestational diabetes mellitus in childbirth, unspecified control: Secondary | ICD-10-CM

## 2021-02-19 DIAGNOSIS — O24419 Gestational diabetes mellitus in pregnancy, unspecified control: Secondary | ICD-10-CM

## 2021-02-19 DIAGNOSIS — Z789 Other specified health status: Secondary | ICD-10-CM

## 2021-02-19 NOTE — Patient Instructions (Signed)
Mantenimiento de la salud en las mujeres Health Maintenance, Female Adoptar un estilo de vida saludable y recibir atencin preventiva son importantes para promover la salud y el bienestar. Consulte al mdico sobre:  El esquema adecuado para hacerse pruebas y exmenes peridicos.  Cosas que puede hacer por su cuenta para prevenir enfermedades y mantenerse sana. Qu debo saber sobre la dieta, el peso y el ejercicio? Consuma una dieta saludable  Consuma una dieta que incluya muchas verduras, frutas, productos lcteos con bajo contenido de grasa y protenas magras.  No consuma muchos alimentos ricos en grasas slidas, azcares agregados o sodio.   Mantenga un peso saludable El ndice de masa muscular (IMC) se utiliza para identificar problemas de peso. Proporciona una estimacin de la grasa corporal basndose en el peso y la altura. Su mdico puede ayudarle a determinar su IMC y a lograr o mantener un peso saludable. Haga ejercicio con regularidad Haga ejercicio con regularidad. Esta es una de las prcticas ms importantes que puede hacer por su salud. La mayora de los adultos deben seguir estas pautas:  Realizar, al menos, 150minutos de actividad fsica por semana. El ejercicio debe aumentar la frecuencia cardaca y hacerlo transpirar (ejercicio de intensidad moderada).  Hacer ejercicios de fortalecimiento por lo menos dos veces por semana. Agregue esto a su plan de ejercicio de intensidad moderada.  Pasar menos tiempo sentados. Incluso la actividad fsica ligera puede ser beneficiosa. Controle sus niveles de colesterol y lpidos en la sangre Comience a realizarse anlisis de lpidos y colesterol en la sangre a los 20aos y luego reptalos cada 5aos. Hgase controlar los niveles de colesterol con mayor frecuencia si:  Sus niveles de lpidos y colesterol son altos.  Es mayor de 40aos.  Presenta un alto riesgo de padecer enfermedades cardacas. Qu debo saber sobre las pruebas de  deteccin del cncer? Segn su historia clnica y sus antecedentes familiares, es posible que deba realizarse pruebas de deteccin del cncer en diferentes edades. Esto puede incluir pruebas de deteccin de lo siguiente:  Cncer de mama.  Cncer de cuello uterino.  Cncer colorrectal.  Cncer de piel.  Cncer de pulmn. Qu debo saber sobre la enfermedad cardaca, la diabetes y la hipertensin arterial? Presin arterial y enfermedad cardaca  La hipertensin arterial causa enfermedades cardacas y aumenta el riesgo de accidente cerebrovascular. Es ms probable que esto se manifieste en las personas que tienen lecturas de presin arterial alta, tienen ascendencia africana o tienen sobrepeso.  Hgase controlar la presin arterial: ? Cada 3 a 5 aos si tiene entre 18 y 39 aos. ? Todos los aos si es mayor de 40aos. Diabetes Realcese exmenes de deteccin de la diabetes con regularidad. Este anlisis revisa el nivel de azcar en la sangre en ayunas. Hgase las pruebas de deteccin:  Cada tresaos despus de los 40aos de edad si tiene un peso normal y un bajo riesgo de padecer diabetes.  Con ms frecuencia y a partir de una edad inferior si tiene sobrepeso o un alto riesgo de padecer diabetes. Qu debo saber sobre la prevencin de infecciones? Hepatitis B Si tiene un riesgo ms alto de contraer hepatitis B, debe someterse a un examen de deteccin de este virus. Hable con el mdico para averiguar si tiene riesgo de contraer la infeccin por hepatitis B. Hepatitis C Se recomienda el anlisis a:  Todos los que nacieron entre 1945 y 1965.  Todas las personas que tengan un riesgo de haber contrado hepatitis C. Enfermedades de transmisin sexual (ETS)  Hgase   las pruebas de deteccin de ITS, incluidas la gonorrea y la clamidia, si: ? Es sexualmente activa y es menor de 24aos. ? Es mayor de 24aos, y el mdico le informa que corre riesgo de tener este tipo de  infecciones. ? La actividad sexual ha cambiado desde que le hicieron la ltima prueba de deteccin y tiene un riesgo mayor de tener clamidia o gonorrea. Pregntele al mdico si usted tiene riesgo.  Pregntele al mdico si usted tiene un alto riesgo de contraer VIH. El mdico tambin puede recomendarle un medicamento recetado para ayudar a evitar la infeccin por el VIH. Si elige tomar medicamentos para prevenir el VIH, primero debe hacerse los anlisis de deteccin del VIH. Luego debe hacerse anlisis cada 3meses mientras est tomando los medicamentos. Embarazo  Si est por dejar de menstruar (fase premenopusica) y usted puede quedar embarazada, busque asesoramiento antes de quedar embarazada.  Tome de 400 a 800microgramos (mcg) de cido flico todos los das si queda embarazada.  Pida mtodos de control de la natalidad (anticonceptivos) si desea evitar un embarazo no deseado. Osteoporosis y menopausia La osteoporosis es una enfermedad en la que los huesos pierden los minerales y la fuerza por el avance de la edad. El resultado pueden ser fracturas en los huesos. Si tiene 65aos o ms, o si est en riesgo de sufrir osteoporosis y fracturas, pregunte a su mdico si debe:  Hacerse pruebas de deteccin de prdida sea.  Tomar un suplemento de calcio o de vitamina D para reducir el riesgo de fracturas.  Recibir terapia de reemplazo hormonal (TRH) para tratar los sntomas de la menopausia. Siga estas instrucciones en su casa: Estilo de vida  No consuma ningn producto que contenga nicotina o tabaco, como cigarrillos, cigarrillos electrnicos y tabaco de mascar. Si necesita ayuda para dejar de fumar, consulte al mdico.  No consuma drogas.  No comparta agujas.  Solicite ayuda a su mdico si necesita apoyo o informacin para abandonar las drogas. Consumo de alcohol  No beba alcohol si: ? Su mdico le indica no hacerlo. ? Est embarazada, puede estar embarazada o est tratando de quedar  embarazada.  Si bebe alcohol: ? Limite la cantidad que consume de 0 a 1 medida por da. ? Limite la ingesta si est amamantando.  Est atento a la cantidad de alcohol que hay en las bebidas que toma. En [place], una medida equivale a una botella de cerveza de 12oz (355ml), un vaso de vino de 5oz (148ml) o un vaso de una bebida alcohlica de alta graduacin de 1oz (44ml). Instrucciones generales  Realcese los estudios de rutina de la salud, dentales y de la vista.  Mantngase al da con las vacunas.  Infrmele a su mdico si: ? Se siente deprimida con frecuencia. ? Alguna vez ha sido vctima de maltrato o no se siente segura en su casa. Resumen  Adoptar un estilo de vida saludable y recibir atencin preventiva son importantes para promover la salud y el bienestar.  Siga las instrucciones del mdico acerca de una dieta saludable, el ejercicio y la realizacin de pruebas o exmenes para detectar enfermedades.  Siga las instrucciones del mdico con respecto al control del colesterol y la presin arterial. Esta informacin no tiene como fin reemplazar el consejo del mdico. Asegrese de hacerle al mdico cualquier pregunta que tenga. Document Revised: 10/14/2018 Document Reviewed: 10/14/2018 Elsevier Patient Education  2021 Elsevier Inc.  

## 2021-02-19 NOTE — Progress Notes (Signed)
    Post Partum Visit Note  Victoria Richmond is a 35 y.o. [id number] female who presents for a postpartum visit. She is 6 weeks postpartum following a repeat cesarean section.  I have fully reviewed the prenatal and intrapartum course. The delivery was at 39/1 gestational weeks.  Anesthesia: Epidural & Spinal. Postpartum course has been unremarkable. Baby is doing well. Baby is feeding by both breast and bottle - [employer] Good Start. Bleeding no bleeding. Bowel function is normal. Bladder function is normal. Patient is not sexually active. Contraception method is tubal ligation. Postpartum depression screening: negative.   The pregnancy intention screening data noted above was reviewed. Potential methods of contraception were discussed. The patient elected to proceed with Female Sterilization.     Health Maintenance Due  Topic Date Due  . PNEUMOCOCCAL POLYSACCHARIDE VACCINE AGE 34-64 HIGH RISK  Never done  . FOOT EXAM  Never done  . OPHTHALMOLOGY EXAM  Never done  . URINE MICROALBUMIN  Never done  . HEMOGLOBIN A1C  10/25/2017    Medical record reviewed  Review of Systems Pertinent items are noted in HPI.  Objective:  LMP 04/09/2020    General:  alert   Breasts:  not indicated  Lungs: clear to auscultation bilaterally  Heart:  regular rate and rhythm, S1, S2 normal, no murmur, click, rub or gallop  Abdomen: soft, non-tender; bowel sounds normal; no masses,  no organomegaly   Wound well approximated incision  GU exam:  not indicated       Assessment:    There are no diagnoses linked to this encounter.  Nl postpartum exam.  GDM HTN  Plan:   Essential components of care per ACOG recommendations:  1.  Mood and well being: Patient with negative depression screening today. Reviewed local resources for support.  - Patient tobacco use? No.   - hx of drug use? No.    2. Infant care and feeding:  -Patient currently breastmilk feeding? Yes. Discussed returning to work and  pumping.  -Social determinants of health (SDOH) reviewed in EPIC. No concerns  3. Sexuality, contraception and birth spacing - Patient does not want a pregnancy in the next year.  Desired family size is completed  - Reviewed forms of contraception in tiered fashion. Patient desired bilateral tubal ligation today.     4. Sleep and fatigue -Encouraged family/partner/community support of 4 hrs of uninterrupted sleep to help with mood and fatigue  5. Physical Recovery  - Discussed patients delivery and complications. She describes her labor as good. - Patient had a C-section. -    Patient has urinary incontinence? No. - Patient is safe to resume physical and sexual activity  6.  Health Maintenance - HM due items addressed Yes - Last pap smear No results found for: DIAGPAP Pap smear not done at today's visit.  -Breast Cancer screening indicated? No. Pt reports last pap smear within the past 3 yrs with the [facility]  7. Chronic Disease/Pregnancy Condition follow up: Glucola today d/t H/O GDM Will d/c Norvasc today  BP check with nurse in 2 weeks    Nettie Elm, MD [facility] [employer], [facility]

## 2021-02-20 LAB — GLUCOSE TOLERANCE, 2 HOURS
Glucose, 2 hour: 281 mg/dL — ABNORMAL HIGH (ref 65–139)
Glucose, GTT - Fasting: 159 mg/dL — ABNORMAL HIGH (ref 65–99)

## 2021-02-26 ENCOUNTER — Telehealth: Payer: Self-pay | Admitting: Lactation Services

## 2021-02-26 DIAGNOSIS — O99815 Abnormal glucose complicating the puerperium: Secondary | ICD-10-CM

## 2021-02-26 NOTE — Telephone Encounter (Signed)
Called patient with assistance of Raquel Carrolyn Meiers, [employer].   Advised patient that she failed her 2 hour post partum GTT, indicating she has Type 2 Diabetes.   Referral placed to [facility]. Informed patient they will call her with an appointment.   Patient with no questions or concerns at this time.

## 2021-02-26 NOTE — Telephone Encounter (Signed)
-----   Message from [name], MD sent at 02/23/2021 10:06 AM EDT ----- Please let Ms Lorette Ang that she did not pass your postpartum glucola test. Please refer her to a PCP for further management and treatment.  Thanks Casimiro Needle

## 2021-03-06 ENCOUNTER — Ambulatory Visit: Payer: Self-pay

## 2022-06-11 IMAGING — US US MFM FETAL BPP W/ NON-STRESS
1 series · 13 of 28 positions shown · non-contrast
Comparison: none

[Series 1: us mfm fetal bpp w/ non-stress · 50 acquisitions, 13 frames shown]
[im 2/50]
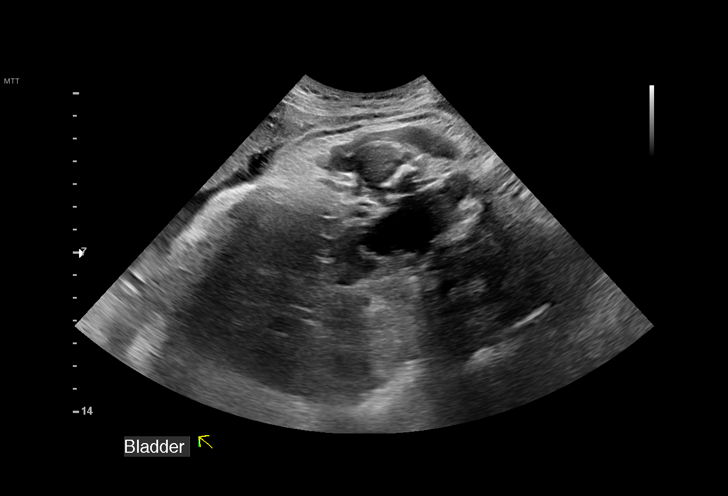
[im 6/50]
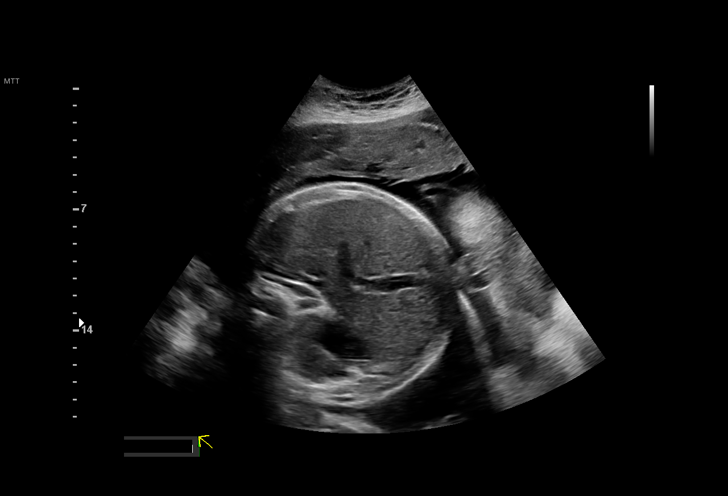
[im 10/50]
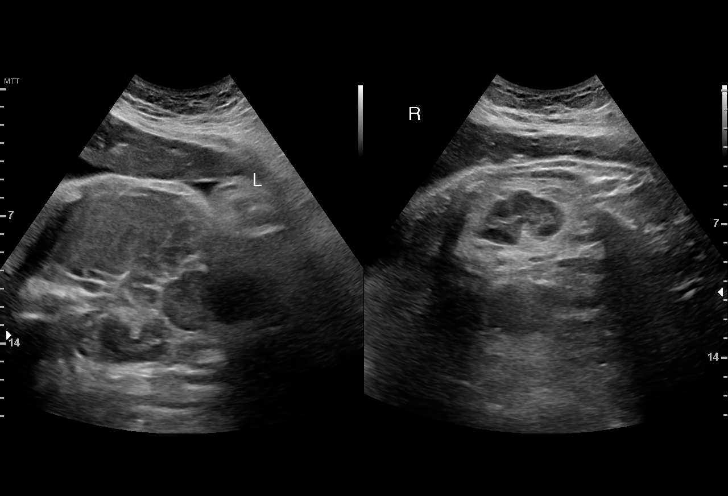
[im 13/50]
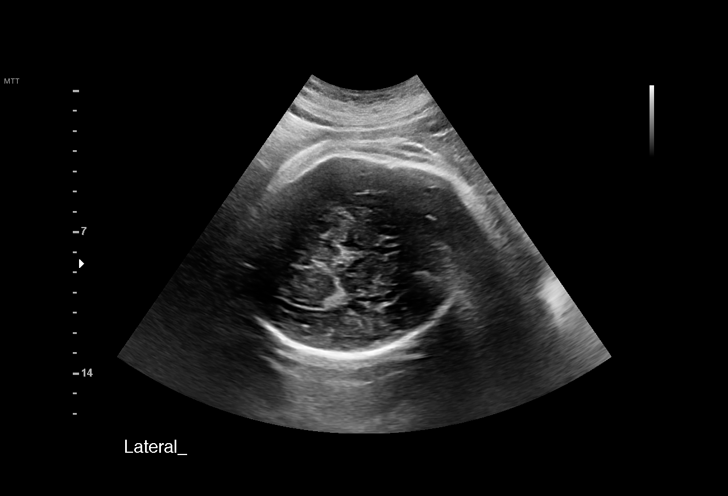
[im 17/50]
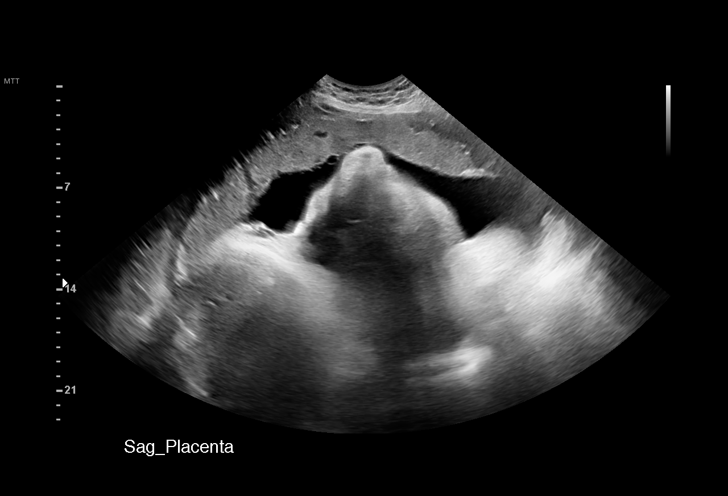
[im 20/50]
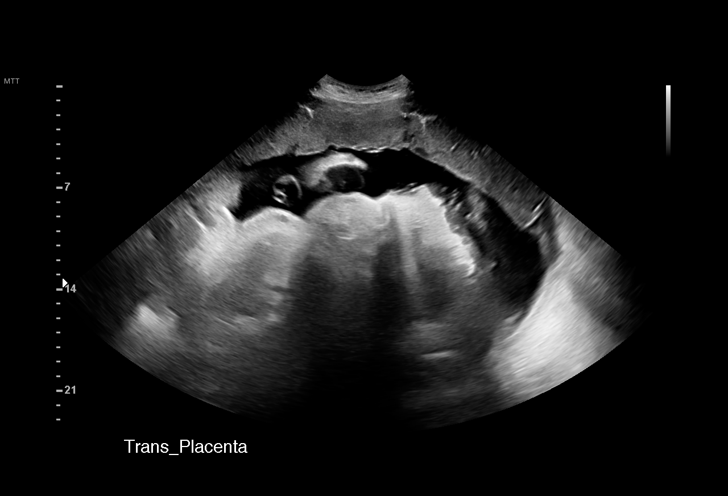
[im 26/50]
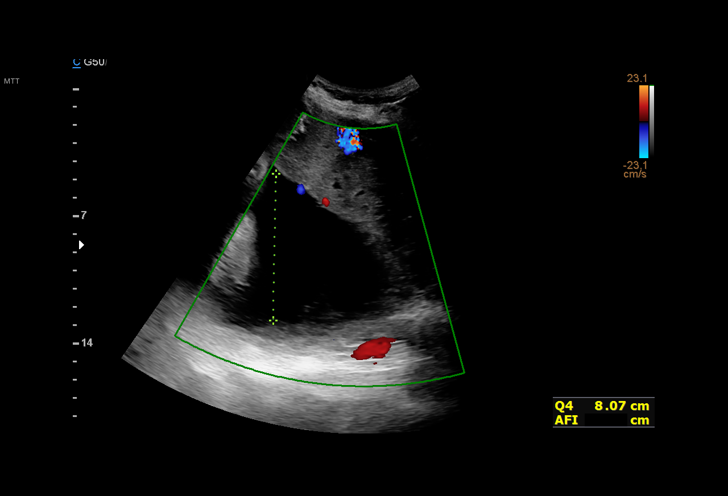
[im 30/50]
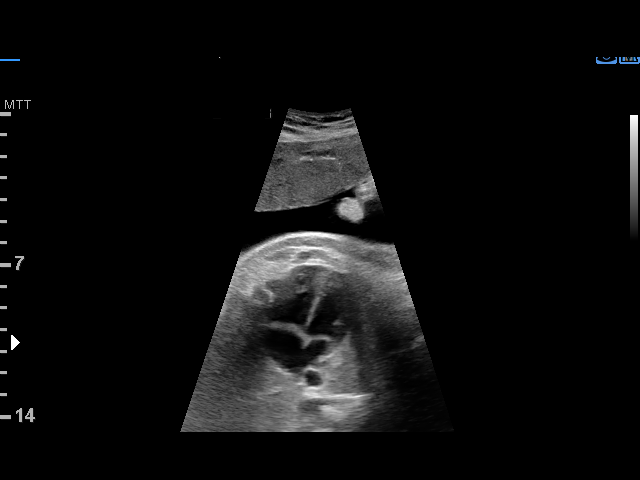
[im 33/50]
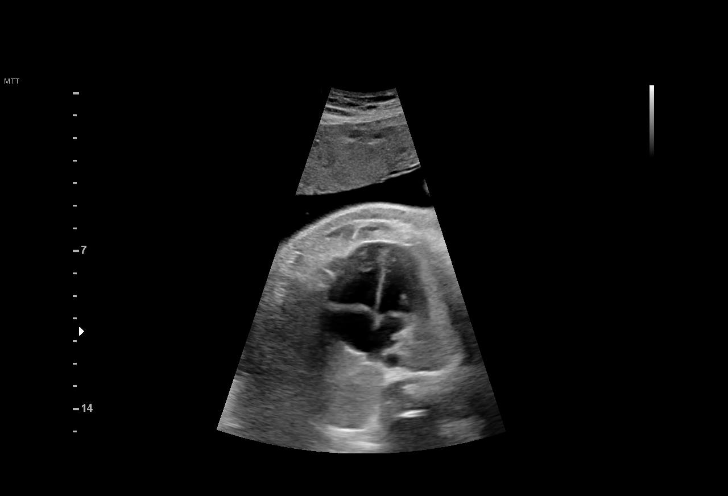
[im 37/50]
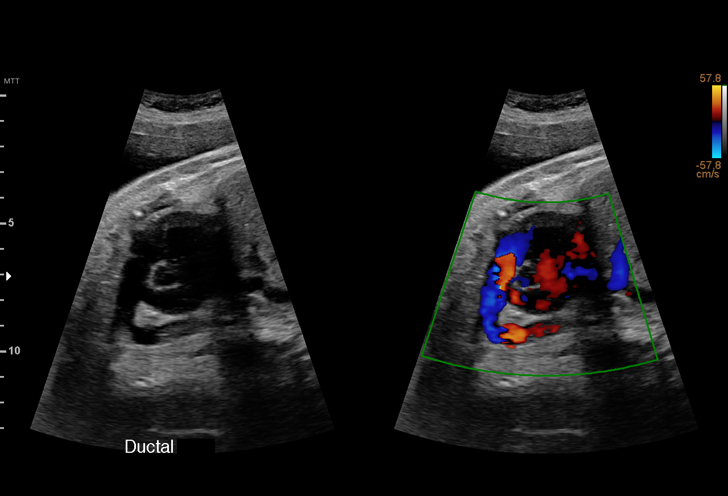
[im 40/50]
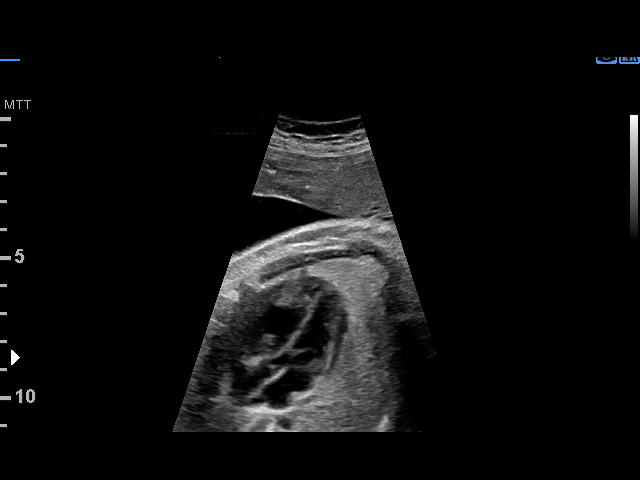
[im 44/50]
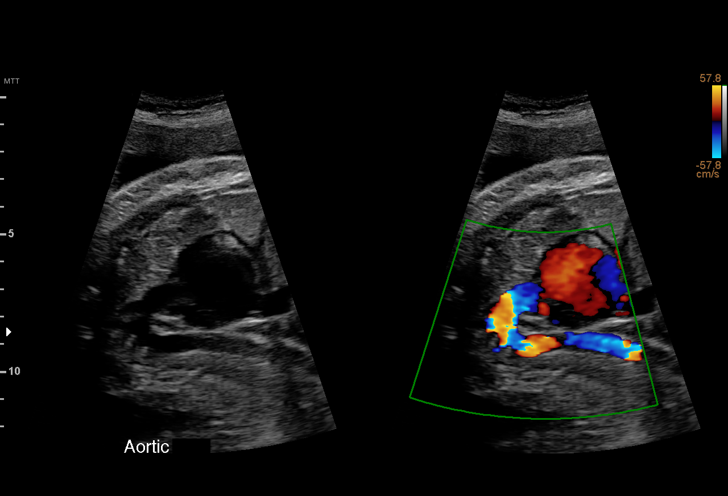
[im 48/50]
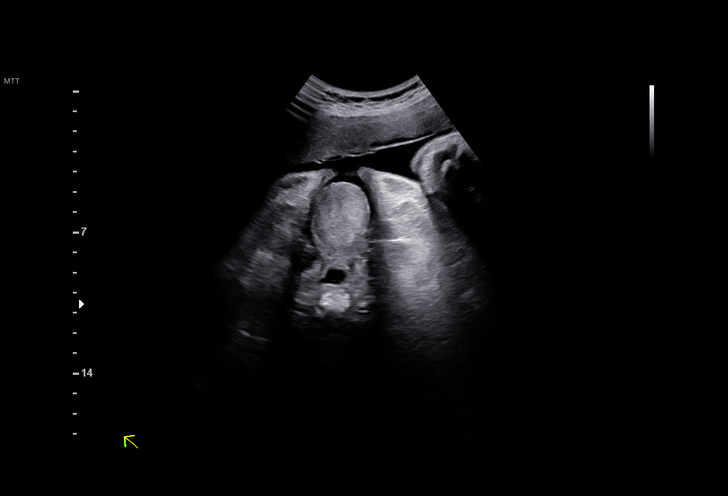

[13 of 28 positions shown; findings below may reference images not displayed]

LAGARDA

    HAVARD/NONSTRESS

Indications

 Gestational diabetes in pregnancy,
 controlled by oral hypoglycemic drugs
 (metformin)
 Polyhydramnios, third trimester, antepartum
 condition or complication, unspecified fetus
 37 weeks gestation of pregnancy
 Advanced maternal age multigravida 35+,
 third trimester
 Previous cesarean delivery, antepartum x 3
 Poor obstetric history: Previous gestational
 diabetes
 Encounter for other antenatal screening
 follow-up
Fetal Evaluation

 Num Of Fetuses:         1
 Fetal Heart Rate(bpm):  153
 Cardiac Activity:       Observed
 Presentation:           Breech
 Placenta:               Anterior
 P. Cord Insertion:      Previously Visualized

 Amniotic Fluid
 AFI FV:      Polyhydramnios

 AFI Sum(cm)     %Tile       Largest Pocket(cm)
 33.6            > 97
 RUQ(cm)       RLQ(cm)       LUQ(cm)        LLQ(cm)

Biophysical Evaluation

 Amniotic F.V:   Polyhydramnios             F. Tone:        Not Observed
 F. Movement:    Observed                   N.S.T:          Reactive
 F. Breathing:   Observed                   Score:          [DATE]
OB History

 Gravidity:    4         Term:   3        Prem:   0        SAB:   0
 TOP:          0       Ectopic:  0        Living: 3
Gestational Age

 LMP:           37w 5d        Date:  04/09/20                 EDD:   01/14/21
 Best:          37w 5d     Det. By:  LMP  (04/09/20)          EDD:   01/14/21
Anatomy

 Cranium:               Previously seen        LVOT:                   Previously seen
 Cavum:                 Previously seen        Aortic Arch:            Previously seen
 Ventricles:            Appears normal         Ductal Arch:            Previously seen
 Choroid Plexus:        Previously seen        Diaphragm:              Appears normal
 Cerebellum:            Previously seen        Stomach:                Appears normal, left
                                                                       sided
 Posterior Fossa:       Previously seen        Abdomen:                Previously seen
 Nuchal Fold:           Not applicable (>20    Abdominal Wall:         Previously seen
                        wks GA)
 Face:                  Orbits and profile     Cord Vessels:           Previously seen
                        previously seen
 Lips:                  Previously seen        Kidneys:                Appear normal
 Palate:                Not well visualized    Bladder:                Appears normal
 Thoracic:              Previously seen        Spine:                  Previously seen
 Heart:                 Appears normal         Upper Extremities:      Visualized
                        (4CH, axis, and                                previously
                        situs)
 RVOT:                  Previously seen        Lower Extremities:      Visualized
                                                                       previously

 Other:  Left foot/heel, Nasal bone, and Lenses previously visualized.
         Technically difficult due to advanced GA and fetal position.
Cervix Uterus Adnexa

 Cervix
 Not visualized (advanced GA >42wks)

 Uterus
 No abnormality visualized.

 Right Ovary
 No adnexal mass visualized.

 Left Ovary
 No adnexal mass visualized.
 Cul De Sac
 No free fluid seen.

 Adnexa
 No abnormality visualized.
Comments

 This patient was seen for a biophysical profile due to
 gestational diabetes currently treated with Metformin with
 polyhydramnios and macrosomia noted during her prior
 ultrasound exams.  The patient reports that she is
 experiencing occasional contractions.
 A biophysical profile performed today was [DATE].  She
 received a -2 for lack of fetal tone.  She subsequently had a
 reactive nonstress test, making her biophysical profile score
 [DATE].
 Polyhydramnios with a total AFI of 33.6 cm continues to be
 noted today.
 She will return in 1 week for another biophysical profile.  She
 already has a repeat cesarean delivery scheduled on January 08, 2021.
 She was advised to go to the hospital for further evaluation
 should she continue to experience frequent contractions.
 Labor precautions were reviewed today.  All conversations
 were held with the patient with a Spanish interpreter.
